# Patient Record
Sex: Male | Born: 1975 | Race: White | Hispanic: No | Marital: Married | Smoking: Current every day smoker
Health system: Southern US, Community
[De-identification: ages and names within clinical notes are randomized; demographics above are authoritative.]

## PROBLEM LIST (undated history)

## (undated) DIAGNOSIS — K449 Diaphragmatic hernia without obstruction or gangrene: Secondary | ICD-10-CM

## (undated) DIAGNOSIS — K219 Gastro-esophageal reflux disease without esophagitis: Secondary | ICD-10-CM

## (undated) HISTORY — DX: Gastro-esophageal reflux disease without esophagitis: K21.9

---

## 2010-01-23 ENCOUNTER — Ambulatory Visit
Admission: RE | Admit: 2010-01-23 | Discharge: 2010-01-23 | Payer: Self-pay | Source: Home / Self Care | Attending: Gastroenterology | Admitting: Gastroenterology

## 2010-01-23 ENCOUNTER — Encounter: Payer: Self-pay | Admitting: Internal Medicine

## 2010-01-23 DIAGNOSIS — R1011 Right upper quadrant pain: Secondary | ICD-10-CM | POA: Insufficient documentation

## 2010-01-23 DIAGNOSIS — K625 Hemorrhage of anus and rectum: Secondary | ICD-10-CM | POA: Insufficient documentation

## 2010-02-08 NOTE — Assessment & Plan Note (Signed)
Summary: Randy Schroeder MOVEMENTS/SS   Visit Type:  Initial Visit Primary Care Provider:  Sherryll Burger  CC:  vomiting and irregular bowel movements.  History of Present Illness: Randy Schroeder is a pleasant 35 year old Caucasian male who is self-referred. He states he had an acute onset of RUQ pain, +nausea and was admitted by Dr Sherryll Burger to [facility] in May 2011 for several days. Reportedly had ultrasound completed, no endoscopy. We do not have these reports currently. He also has a remote hx of endoscopy when he was 35 years old which he reports demonstrated a hiatal hernia. He was doing well from his admission in May 2011 until a few weeks ago when the RUQ pain occurred again, doubling him over, associated with nausea as well. He does have a hx of GERD, but he does not currently take any medications. He takes Aleve if abdominal pain occurs, but this is rare. Denies excedrin, goodys, bc. No dysphagia or odynophagia. Has taken Nexium remotely in past, which seemed to work well for intermittent reflux.  He also reports blood in stool intermittently every 2-3 months X 1 year, usually early in the morning. He has a BM every few days that is soft, without straining. He denies feeling constipated or bloated. He has no FH of colon cancer. He has no loss of appetite or wt loss.   Current Medications (verified): 1)  [place] Vitamins .... Two Tablets Daily  Allergies (verified): No Known Drug Allergies  Past History:  Past Medical History: GERD  Past Surgical History: None  Family History: Mother:living, healthy Father:living, obese, HTN Siblings: 2 sisters non-contributory Grandfather: liver, +ETOH No FH of Colon Cancer:  Social History: Occupation: Health and safety inspector Married X 4 years 2 children boy:9, girl:4 Patient currently smokes. 1/2ppd, 10 years Alcohol Use - no Smoking Status:  current  Review of Systems General:  Denies fever, chills, and anorexia. Eyes:  Denies  blurring, irritation, and discharge. ENT:  Denies sore throat, hoarseness, and difficulty swallowing. CV:  Denies chest pains and syncope. Resp:  Denies dyspnea at rest and wheezing. GI:  Complains of abdominal pain and bloody BM's; denies difficulty swallowing, pain on swallowing, nausea, diarrhea, constipation, black BMs, and fecal incontinence. GU:  Denies urinary burning and urinary hesitancy. MS:  Denies joint pain / LOM, joint swelling, and joint stiffness. Derm:  Denies rash, itching, and dry skin. Neuro:  Denies weakness and syncope. Psych:  Denies depression and anxiety. Endo:  Denies cold intolerance and heat intolerance. Heme:  Denies bruising and bleeding.  Vital Signs:  Patient profile:   35 year old male Height:      71 inches Weight:      197 pounds BMI:     27.58 Temp:     98.8 degrees F oral Pulse rate:   64 / minute BP sitting:   120 / 84  (left arm) Cuff size:   regular  Vitals Entered By: Cloria Spring LPN (January 23, 2010 10:50 AM)  Physical Exam  General:  Well developed, well nourished, no acute distress. Head:  Normocephalic and atraumatic. Eyes:  sclera without icterus, conjuctiva clear Mouth:  No deformity or lesions, dentition normal. Lungs:  Clear throughout to auscultation. Heart:  Regular rate and rhythm; no murmurs, rubs,  or bruits. Abdomen:  +BS, soft, non-tender, non-distended. NO rebound or guarding, No HSM. no hernias noted. Msk:  Symmetrical with no gross deformities. Normal posture. Pulses:  Normal pulses noted. Neurologic:  Alert and  oriented x4;  grossly  normal neurologically. Skin:  Intact without significant lesions or rashes. Psych:  Alert and cooperative. Normal mood and affect.  Impression & Recommendations:  Problem # 1:  ABDOMINAL PAIN-RUQ (ICD-48.2)  35 year old Caucasian male with intermittent but rare episodes of RUQ pain, actually admitted to [facility] for several days by Dr. Sherryll Burger in May 2011, no reports available  currently. Pain associated with nausea. Does have hx of reflux, but is not currently taking medication. Last episode of RUQ pain a few weeks ago, causing him to double over, again with nausea associated. He denies dysphagia, odynophagia. Rare Advil use, only with onset of pain. +smoker. ?biliary component vs PUD. No records currently, and these have been requested.   Retrieve records from [facility] Nexium 40 mg by mouth daily, rx sent to pharmacy of choice, as this has worked for him in the past Possible EGD along with TCS in future if RUQ persists and after reviewing records, labs.   Orders: Consultation Level III ([address])  Problem # 2:  RECTAL BLEEDING (ICD-569.3)  Intermittent rectal bleeding X 1 year, every 2-3 months. No abdominal pain associated, denies constipation, no straining. BM every few days. No loss of appetite or weight loss, no family hx of colon cancer. Most likely r/t benign anorectal source, but occult malignancy can not be excluded. Will schedule for colonoscopy.    TCS with RMR in near future: the risks, benefits, alternatives have been discussed in detail with the pt; verbal consent obtained.   Orders: Consultation Level III (60454) Prescriptions: NEXIUM 40 MG CPDR (ESOMEPRAZOLE MAGNESIUM) take 1 by mouth 30 minutes before breakfast daily  #31 x 5   Entered and Authorized by:   Gerrit Halls NP   Signed by:   Gerrit Halls NP on 01/23/2010   Method used:   Faxed to ...       [facility] (retail)       [address]       [place], [place]  [address]       Ph: [phone]       Fax: [phone]   RxID:   [id number]

## 2010-02-08 NOTE — Letter (Addendum)
Summary: TCS POSS EGD ORDER  TCS POSS EGD ORDER   Imported By: Ave Filter 01/23/2010 11:52:43  _____________________________________________________________________  External Attachment:    Type:   Image     Comment:   External Document  Appended Document: TCS POSS EGD ORDER Patient called and canceled procedure due to insurance not paying for all of the procedure and he wants to cancel for now

## 2010-02-13 ENCOUNTER — Encounter: Payer: Self-pay | Admitting: Internal Medicine

## 2012-04-08 ENCOUNTER — Encounter (HOSPITAL_COMMUNITY): Payer: Self-pay | Admitting: *Deleted

## 2012-04-08 ENCOUNTER — Emergency Department (HOSPITAL_COMMUNITY): Payer: BC Managed Care – PPO

## 2012-04-08 ENCOUNTER — Emergency Department (HOSPITAL_COMMUNITY)
Admission: EM | Admit: 2012-04-08 | Discharge: 2012-04-08 | Disposition: A | Discharge status: Home / Self Care | Payer: BC Managed Care – PPO | Attending: Emergency Medicine | Admitting: Emergency Medicine

## 2012-04-08 DIAGNOSIS — R0602 Shortness of breath: Secondary | ICD-10-CM | POA: Insufficient documentation

## 2012-04-08 DIAGNOSIS — T754XXA Electrocution, initial encounter: Secondary | ICD-10-CM | POA: Insufficient documentation

## 2012-04-08 DIAGNOSIS — Y9389 Activity, other specified: Secondary | ICD-10-CM | POA: Insufficient documentation

## 2012-04-08 DIAGNOSIS — F17200 Nicotine dependence, unspecified, uncomplicated: Secondary | ICD-10-CM | POA: Insufficient documentation

## 2012-04-08 DIAGNOSIS — Z23 Encounter for immunization: Secondary | ICD-10-CM | POA: Insufficient documentation

## 2012-04-08 DIAGNOSIS — Z8719 Personal history of other diseases of the digestive system: Secondary | ICD-10-CM | POA: Insufficient documentation

## 2012-04-08 DIAGNOSIS — W868XXA Exposure to other electric current, initial encounter: Secondary | ICD-10-CM | POA: Insufficient documentation

## 2012-04-08 DIAGNOSIS — Y92009 Unspecified place in unspecified non-institutional (private) residence as the place of occurrence of the external cause: Secondary | ICD-10-CM | POA: Insufficient documentation

## 2012-04-08 HISTORY — DX: Diaphragmatic hernia without obstruction or gangrene: K44.9

## 2012-04-08 LAB — URINALYSIS, ROUTINE W REFLEX MICROSCOPIC
Glucose, UA: NEGATIVE mg/dL
Leukocytes, UA: NEGATIVE
Urobilinogen, UA: 0.2 mg/dL (ref 0.0–1.0)

## 2012-04-08 LAB — URINE MICROSCOPIC-ADD ON

## 2012-04-08 LAB — CBC WITH DIFFERENTIAL/PLATELET
Basophils Relative: 0 % (ref 0–1)
Eosinophils Absolute: 0 10*3/uL (ref 0.0–0.7)
Eosinophils Relative: 0 % (ref 0–5)
Lymphs Abs: 3 10*3/uL (ref 0.7–4.0)
MCH: 27.9 pg (ref 26.0–34.0)
MCHC: 32.8 g/dL (ref 30.0–36.0)
MCV: 85.1 fL (ref 78.0–100.0)
Platelets: 292 10*3/uL (ref 150–400)
RBC: 5.51 MIL/uL (ref 4.22–5.81)
RDW: 14.7 % (ref 11.5–15.5)

## 2012-04-08 LAB — TROPONIN I: Troponin I: <0.3 ng/mL (ref <0.30)

## 2012-04-08 LAB — COMPREHENSIVE METABOLIC PANEL
ALT: 16 U/L (ref 0–53)
Albumin: 4.9 g/dL (ref 3.5–5.2)
Calcium: 10 mg/dL (ref 8.4–10.5)
GFR calc Af Amer: >90 mL/min (ref >90)
Glucose, Bld: 74 mg/dL (ref 70–99)
Sodium: 141 mEq/L (ref 135–145)
Total Protein: 8.4 g/dL — ABNORMAL HIGH (ref 6.0–8.3)

## 2012-04-08 MED ORDER — TETANUS-DIPHTH-ACELL PERTUSSIS 5-2.5-18.5 LF-MCG/0.5 IM SUSP
0.5000 mL | Freq: Once | INTRAMUSCULAR | Status: AC
  Administered 2012-04-08: 0.5 mL via INTRAMUSCULAR
  Filled 2012-04-08: qty 0.5

## 2012-04-08 NOTE — ED Provider Notes (Signed)
History  This chart was scribed for Glynn Octave, MD by Shari Heritage, ED Scribe. The patient was seen in room APAH5/APAH5. Patient's care was started at 1553.   CSN: [id number]  Arrival date & time 04/08/12  1544   First MD Initiated Contact with Patient 04/08/12 1553      Chief Complaint  Patient presents with  . Electric Shock    The history is provided by the patient. No language interpreter was used.    HPI Comments: Randy Schroeder is a 37 y.o. male who presents to the Emergency Department complaining of electric shock from a capacitor on a house hold air conditioning unit immediately prior to arrival. He states that shock came from a 460 V capacitor. He says that he was trying to put a coil on a unit when he was electrocuted. His left forearm came in contact with the metal prongs of the capacitor during the shock. He states that he saw whiite flashes during the shock and developed palpitations which are now resolved. He says that immediately after the shock he was mildly short of breath, but that is improved as well. At this time, patient is close to baseline status. He denies chest pain, headaches, or any pain. He has a hiatal hernia, but has no other pertinent past medical history.    Past Medical History  Diagnosis Date  . Hiatal hernia     Reported by patient 04/08/2012    History reviewed. No pertinent family history.  History  Substance Use Topics  . Smoking status: Current Every Day Smoker  . Smokeless tobacco: Not on file  . Alcohol Use: No      Review of Systems A complete 10 system review of systems was obtained and all systems are negative except as noted in the HPI and PMH.    Allergies  Bee venom  Home Medications   Current Outpatient Rx  Name  Route  Sig  Dispense  Refill  . Multiple Vitamin (MULTIVITAMIN WITH MINERALS) TABS   Oral   Take 1 tablet by mouth every morning.           Triage Vitals: BP 129/84  Pulse 92  Temp(Src) 98.1 F (36.7  C) (Oral)  Resp 18  Ht 5\' 10"  (1.778 m)  Wt 185 lb (83.915 kg)  BMI 26.54 kg/m2  SpO2 98%  Physical Exam  Constitutional: He is oriented to person, place, and time. He appears well-developed and well-nourished.  HENT:  Head: Normocephalic and atraumatic.  Eyes: Conjunctivae and EOM are normal. Pupils are equal, round, and reactive to light.  Neck: Normal range of motion. Neck supple.  Cardiovascular: Normal rate, regular rhythm and normal heart sounds.   No murmur heard. Pulmonary/Chest: Effort normal and breath sounds normal. No accessory muscle usage. No apnea. No respiratory distress. He has no decreased breath sounds. He has no wheezes. He has no rhonchi. He has no rales.  Abdominal: Soft. He exhibits no distension.  Musculoskeletal: Normal range of motion.  Parallel linear abrasion to right dorsal forearm. +2 radial pulse. Cardinal hand movements intact. No scorch marks to palms or soles.  Neurological: He is alert and oriented to person, place, and time.  Skin: Skin is warm and dry.  Psychiatric: He has a normal mood and affect. His behavior is normal.    ED Course  Procedures (including critical care time) DIAGNOSTIC STUDIES: Oxygen Saturation is 98% on room air, normal by my interpretation.    COORDINATION OF CARE: 4:06 PM- Patient  informed of current plan for treatment and evaluation and agrees with plan at this time.   5:24 PM- Chest x-ray is unremarkable. Upon recheck, patient is tolerant of fluids and feels fine. Am awaiting lab results to check kidney and heart function before discharge.  Labs Reviewed  COMPREHENSIVE METABOLIC PANEL - Abnormal; Notable for the following:    Total Protein 8.4 (*)    All other components within normal limits  CBC WITH DIFFERENTIAL  TROPONIN I  CK  URINALYSIS, ROUTINE W REFLEX MICROSCOPIC     Dg Chest 2 View  04/08/2012  *RADIOLOGY REPORT*  Clinical Data: Electrical shock.  CHEST - 2 VIEW  Comparison: 04/18/2009.  Findings: The  cardiac silhouette, mediastinal and hilar contours are within normal limits and stable. The lungs are clear.  No pleural effusions.  The bony thorax is intact.  IMPRESSION: Normal chest x-ray.   Original Report Authenticated By: Rudie Meyer, M.D.      1. Electrical shock of hand, initial encounter       MDM  Presents 3 hours after an electrical shock from an air conditioning unit that was 460 V. Denies chest pain, nausea, vomiting, palpitations. Uncertain if he lost consciousness but says he saw some white flashes. Feels back to baseline now. Abrasion on the dorsal forearm from metal prongs.  Update tetanus. Chest x-ray negative. Labs unremarkable with normal creatinine and CK   patient tolerating by mouth in ambulatory in the ED. No arrhythmias noted on monitor.    Date: 04/08/2012  Rate: 99  Rhythm: normal sinus rhythm  QRS Axis: normal  Intervals: normal  ST/T Wave abnormalities: normal  Conduction Disutrbances:none  Narrative Interpretation:   Old EKG Reviewed: none available     I personally performed the services described in this documentation, which was scribed in my presence. The recorded information has been reviewed and is accurate.       Glynn Octave, MD 04/08/12 1736

## 2012-04-08 NOTE — ED Notes (Signed)
eletrocuted by an air condition unit, 460 volt (starting compacity), states he does not feel right, denies cp but felt like heart racing earlier, saw white flashes and pt unknown of LOC, has wound to right forearm

## 2012-04-08 NOTE — ED Notes (Signed)
Patient ambulated around nurses station without difficulty and with a steady gait.  Patient drinking sprite without difficulty. No needs voiced at this time.

## 2013-10-10 ENCOUNTER — Inpatient Hospital Stay (HOSPITAL_COMMUNITY)
Admission: EM | Admit: 2013-10-10 | Discharge: 2013-10-12 | DRG: 395 | Disposition: A | Discharge status: Home / Self Care | Payer: 59 | Attending: General Surgery | Admitting: General Surgery

## 2013-10-10 ENCOUNTER — Encounter (HOSPITAL_COMMUNITY): Payer: Self-pay | Admitting: Emergency Medicine

## 2013-10-10 ENCOUNTER — Emergency Department (HOSPITAL_COMMUNITY): Payer: 59

## 2013-10-10 DIAGNOSIS — R6510 Systemic inflammatory response syndrome (SIRS) of non-infectious origin without acute organ dysfunction: Secondary | ICD-10-CM

## 2013-10-10 DIAGNOSIS — Z23 Encounter for immunization: Secondary | ICD-10-CM

## 2013-10-10 DIAGNOSIS — K6289 Other specified diseases of anus and rectum: Secondary | ICD-10-CM | POA: Diagnosis not present

## 2013-10-10 DIAGNOSIS — K611 Rectal abscess: Secondary | ICD-10-CM | POA: Diagnosis not present

## 2013-10-10 DIAGNOSIS — F17200 Nicotine dependence, unspecified, uncomplicated: Secondary | ICD-10-CM | POA: Diagnosis present

## 2013-10-10 DIAGNOSIS — K219 Gastro-esophageal reflux disease without esophagitis: Secondary | ICD-10-CM | POA: Diagnosis present

## 2013-10-10 LAB — COMPREHENSIVE METABOLIC PANEL
ALK PHOS: 82 U/L (ref 39–117)
ALT: 31 U/L (ref 0–53)
ANION GAP: 17 — AB (ref 5–15)
AST: 16 U/L (ref 0–37)
Albumin: 3.9 g/dL (ref 3.5–5.2)
BILIRUBIN TOTAL: 1 mg/dL (ref 0.3–1.2)
BUN: 10 mg/dL (ref 6–23)
CO2: 18 mEq/L — ABNORMAL LOW (ref 19–32)
Calcium: 9.4 mg/dL (ref 8.4–10.5)
Chloride: 97 mEq/L (ref 96–112)
Creatinine, Ser: 0.81 mg/dL (ref 0.50–1.35)
GLUCOSE: 105 mg/dL — AB (ref 70–99)
POTASSIUM: 3.7 meq/L (ref 3.7–5.3)
Sodium: 132 mEq/L — ABNORMAL LOW (ref 137–147)
TOTAL PROTEIN: 8.1 g/dL (ref 6.0–8.3)

## 2013-10-10 LAB — SURGICAL PCR SCREEN
MRSA, PCR: NEGATIVE
Staphylococcus aureus: NEGATIVE

## 2013-10-10 LAB — CBC WITH DIFFERENTIAL/PLATELET
BASOS PCT: 0 % (ref 0–1)
Basophils Absolute: 0 10*3/uL (ref 0.0–0.1)
EOS PCT: 0 % (ref 0–5)
Eosinophils Absolute: 0 10*3/uL (ref 0.0–0.7)
HCT: 42.8 % (ref 39.0–52.0)
Hemoglobin: 15 g/dL (ref 13.0–17.0)
LYMPHS ABS: 1.5 10*3/uL (ref 0.7–4.0)
Lymphocytes Relative: 7 % — ABNORMAL LOW (ref 12–46)
MCH: 29.2 pg (ref 26.0–34.0)
MCHC: 35 g/dL (ref 30.0–36.0)
MCV: 83.3 fL (ref 78.0–100.0)
MONO ABS: 1.5 10*3/uL — AB (ref 0.1–1.0)
Monocytes Relative: 7 % (ref 3–12)
NEUTROS PCT: 86 % — AB (ref 43–77)
Neutro Abs: 18.6 10*3/uL — ABNORMAL HIGH (ref 1.7–7.7)
Platelets: 311 10*3/uL (ref 150–400)
RBC: 5.14 MIL/uL (ref 4.22–5.81)
RDW: 14 % (ref 11.5–15.5)
WBC: 21.6 10*3/uL — AB (ref 4.0–10.5)

## 2013-10-10 MED ORDER — OXYCODONE HCL 5 MG PO TABS
5.0000 mg | ORAL_TABLET | ORAL | Status: DC | PRN
  Administered 2013-10-11 – 2013-10-12 (×5): 5 mg via ORAL
  Filled 2013-10-10 (×5): qty 1

## 2013-10-10 MED ORDER — METRONIDAZOLE IN NACL 5-0.79 MG/ML-% IV SOLN
500.0000 mg | Freq: Three times a day (TID) | INTRAVENOUS | Status: DC
  Administered 2013-10-10 – 2013-10-11 (×5): 500 mg via INTRAVENOUS
  Filled 2013-10-10 (×6): qty 100

## 2013-10-10 MED ORDER — INFLUENZA VAC SPLIT QUAD 0.5 ML IM SUSY
0.5000 mL | PREFILLED_SYRINGE | INTRAMUSCULAR | Status: DC
  Filled 2013-10-10: qty 0.5

## 2013-10-10 MED ORDER — LACTATED RINGERS IV SOLN
INTRAVENOUS | Status: DC
  Administered 2013-10-10 – 2013-10-11 (×2): via INTRAVENOUS

## 2013-10-10 MED ORDER — LORAZEPAM 2 MG/ML IJ SOLN: 1.0000 mg | INTRAMUSCULAR | Status: DC | PRN

## 2013-10-10 MED ORDER — HYDROMORPHONE HCL 1 MG/ML IJ SOLN
1.0000 mg | Freq: Once | INTRAMUSCULAR | Status: AC
  Administered 2013-10-10: 1 mg via INTRAVENOUS
  Filled 2013-10-10: qty 1

## 2013-10-10 MED ORDER — ONDANSETRON HCL 4 MG/2ML IJ SOLN: 4.0000 mg | Freq: Four times a day (QID) | INTRAMUSCULAR | Status: DC | PRN

## 2013-10-10 MED ORDER — PANTOPRAZOLE SODIUM 40 MG PO TBEC
40.0000 mg | DELAYED_RELEASE_TABLET | Freq: Every day | ORAL | Status: DC
  Administered 2013-10-11: 40 mg via ORAL
  Filled 2013-10-10: qty 1

## 2013-10-10 MED ORDER — ENOXAPARIN SODIUM 40 MG/0.4ML ~~LOC~~ SOLN
40.0000 mg | SUBCUTANEOUS | Status: DC
  Administered 2013-10-10: 40 mg via SUBCUTANEOUS
  Filled 2013-10-10: qty 0.4

## 2013-10-10 MED ORDER — HYDROMORPHONE HCL 1 MG/ML IJ SOLN
1.0000 mg | INTRAMUSCULAR | Status: DC | PRN
  Administered 2013-10-10 – 2013-10-11 (×6): 1 mg via INTRAVENOUS
  Filled 2013-10-10 (×7): qty 1

## 2013-10-10 MED ORDER — SODIUM CHLORIDE 0.9 % IV BOLUS (SEPSIS)
1000.0000 mL | Freq: Once | INTRAVENOUS | Status: AC
  Administered 2013-10-10: 1000 mL via INTRAVENOUS

## 2013-10-10 MED ORDER — ACETAMINOPHEN 650 MG RE SUPP: 650.0000 mg | Freq: Four times a day (QID) | RECTAL | Status: DC | PRN

## 2013-10-10 MED ORDER — IOHEXOL 300 MG/ML  SOLN
50.0000 mL | Freq: Once | INTRAMUSCULAR | Status: AC | PRN
  Administered 2013-10-10: 50 mL via ORAL

## 2013-10-10 MED ORDER — DIPHENHYDRAMINE HCL 12.5 MG/5ML PO ELIX: 12.5000 mg | ORAL_SOLUTION | Freq: Four times a day (QID) | ORAL | Status: DC | PRN

## 2013-10-10 MED ORDER — METOCLOPRAMIDE HCL 5 MG/ML IJ SOLN
10.0000 mg | Freq: Once | INTRAMUSCULAR | Status: AC
  Administered 2013-10-10: 10 mg via INTRAVENOUS
  Filled 2013-10-10: qty 2

## 2013-10-10 MED ORDER — DIPHENHYDRAMINE HCL 50 MG/ML IJ SOLN: 12.5000 mg | Freq: Four times a day (QID) | INTRAMUSCULAR | Status: DC | PRN

## 2013-10-10 MED ORDER — CLINDAMYCIN PHOSPHATE 900 MG/50ML IV SOLN
900.0000 mg | Freq: Three times a day (TID) | INTRAVENOUS | Status: DC
  Administered 2013-10-11 – 2013-10-12 (×2): 900 mg via INTRAVENOUS
  Filled 2013-10-10 (×3): qty 50

## 2013-10-10 MED ORDER — CLINDAMYCIN PHOSPHATE 600 MG/50ML IV SOLN
600.0000 mg | Freq: Once | INTRAVENOUS | Status: AC
  Administered 2013-10-10: 600 mg via INTRAVENOUS
  Filled 2013-10-10: qty 50

## 2013-10-10 MED ORDER — CHLORHEXIDINE GLUCONATE 4 % EX LIQD
1.0000 "application " | Freq: Once | CUTANEOUS | Status: AC
  Administered 2013-10-11: 1 via TOPICAL
  Filled 2013-10-10: qty 15

## 2013-10-10 MED ORDER — NICOTINE 21 MG/24HR TD PT24
21.0000 mg | MEDICATED_PATCH | Freq: Every day | TRANSDERMAL | Status: DC
  Filled 2013-10-10 (×3): qty 1

## 2013-10-10 MED ORDER — ACETAMINOPHEN 325 MG PO TABS
650.0000 mg | ORAL_TABLET | Freq: Four times a day (QID) | ORAL | Status: DC | PRN
  Administered 2013-10-10 – 2013-10-11 (×2): 650 mg via ORAL
  Filled 2013-10-10 (×2): qty 2

## 2013-10-10 MED ORDER — IOHEXOL 300 MG/ML  SOLN
100.0000 mL | Freq: Once | INTRAMUSCULAR | Status: AC | PRN
  Administered 2013-10-10: 100 mL via INTRAVENOUS

## 2013-10-10 NOTE — ED Notes (Signed)
Pt reports face tingling, esp around mouth, also reports hands tingling. When BP cuff inflated on left arm, left hand contracts

## 2013-10-10 NOTE — ED Provider Notes (Signed)
CSN: [id number]     Arrival date & time 10/10/13  1012 History   This chart was scribed for Enid SkeensJoshua M Abrar Koone, MD, by Yevette EdwardsAngela Bracken, ED Scribe. This patient was seen in room APA03/APA03 and the patient's care was started at 11:07 AM.  First MD Initiated Contact with Patient 10/10/13 1056     Chief Complaint  Patient presents with  . Rectal Pain    Patient is a 38 y.o. male presenting with abscess. The history is provided by the patient and the spouse. No language interpreter was used.  Abscess Location:  Ano-genital Ano-genital abscess location:  Anus Abscess quality: painful and redness   Duration:  5 days Progression:  Worsening Pain details:    Severity:  Severe (6/10)   Progression:  Worsening Chronicity:  New Relieved by:  Nothing Associated symptoms: fever, nausea and vomiting   Risk factors: no hx of MRSA    HPI Comments: Randy BreedingRichard Schroeder is a 38 y.o. male who presents to the Emergency Department complaining of severe rectal pain associated with a suspected abscess which was first observed five days ago and which has worsened in the past three days. He endorses fever, chills, nausea, and emesis; in the ED the pt's temperature is 100.4 F. He denies a h/o similar symptoms. He also denies a cough, headache, or abdominal pain. He endorses a h/o hiatal hernia. The pt denies a h/o MRSA.    Past Medical History  Diagnosis Date  . GERD (gastroesophageal reflux disease)   . Hiatal hernia     Reported by patient 04/08/2012   History reviewed. No pertinent past surgical history. No family history on file. History  Substance Use Topics  . Smoking status: Current Every Day Smoker  . Smokeless tobacco: Not on file  . Alcohol Use: No    Review of Systems  Constitutional: Positive for fever.  Gastrointestinal: Positive for nausea and vomiting.    A complete 10 system review of systems was obtained, and all systems were negative except where indicated in the HPI and PE.     Allergies  Bee venom  Home Medications   Prior to Admission medications   Medication Sig Start Date End Date Taking? Authorizing Provider  acetaminophen (TYLENOL) 500 MG tablet Take 1,000 mg by mouth every 6 (six) hours as needed for moderate pain.   Yes Historical Provider, MD  diazepam (VALIUM) 2 MG tablet Take 1 tablet by mouth at bedtime.  09/14/13  Yes Historical Provider, MD  Multiple Vitamin (MULTIVITAMIN WITH MINERALS) TABS Take 1 tablet by mouth every morning.   Yes Historical Provider, MD  Pediatric Multivit-Minerals-C (FLINTSTONES COMPLETE PO) Take by mouth.     Yes Historical Provider, MD   Triage Vitals: BP 135/74  Pulse 115  Temp(Src) 100.4 F (38 C) (Oral)  Resp 35  Ht 5\' 10"  (1.778 m)  Wt 200 lb (90.719 kg)  BMI 28.70 kg/m2  SpO2 100%  Physical Exam  Nursing note and vitals reviewed. Constitutional: He appears well-developed and well-nourished. No distress.  HENT:  Head: Normocephalic and atraumatic.  Neck: Normal range of motion.  Cardiovascular: Regular rhythm.   Fast rate; tachycardia.   Pulmonary/Chest: Effort normal and breath sounds normal. No respiratory distress. He has no wheezes.  Abdominal: Soft. There is no tenderness.  Genitourinary:  Erythema, induration and tenderness perirectal, no drainage, tender on rectal exam, mild fluctuance  Neurological: He is alert.  Skin: Skin is warm and dry.  Induration and tenderness on the left inner aspect  of the buttock fold. Very tender to palpation. Warm to palpation. Tenderness along inner aspect.   Psychiatric: He has a normal mood and affect. His behavior is normal.    ED Course  Procedures (including critical care time)  DIAGNOSTIC STUDIES: Oxygen Saturation is 100% on room air, normal by my interpretation.    COORDINATION OF CARE:  11:13 AM- Discussed treatment plan with patient, and the patient agreed to the plan. The plan includes pain medication, IV fluids, and a CT scan. Advised pt that he  may require surgery depending upon the severity of the abscess.   Labs Review Labs Reviewed  CBC WITH DIFFERENTIAL - Abnormal; Notable for the following:    WBC 21.6 (*)    Neutrophils Relative % 86 (*)    Lymphocytes Relative 7 (*)    Neutro Abs 18.6 (*)    Monocytes Absolute 1.5 (*)    All other components within normal limits  COMPREHENSIVE METABOLIC PANEL - Abnormal; Notable for the following:    Sodium 132 (*)    CO2 18 (*)    Glucose, Bld 105 (*)    Anion gap 17 (*)    All other components within normal limits  CULTURE, BLOOD (SINGLE)    Imaging Review Ct Abdomen Pelvis W Contrast  10/10/2013   CLINICAL DATA:  Severe rectal pain with suspected abscess. First observed approximately 5 days ago though has worsened. Fever, chills, nausea and vomiting. Initial encounter.  EXAM: CT ABDOMEN AND PELVIS WITH CONTRAST  TECHNIQUE: Multidetector CT imaging of the abdomen and pelvis was performed using the standard protocol following bolus administration of intravenous contrast.  CONTRAST:  50mL OMNIPAQUE IOHEXOL 300 MG/ML SOLN, OMNIPAQUE IOHEXOL 300 MG/ML SOLN  COMPARISON:  None.  FINDINGS: There is an approximately 3.1 x 2.0 x 3.5 cm peripherally enhancing abscess about the left side of the anus (as measured in greatest oblique axial - image 101, series 2 and coronal- image 69, series 5) dimensions respectively. There is a tiny focus of subcutaneous emphysema within the nondependent portion of this fluid collection (representative axial images 99, series 2 and coronal image 69, series 6). This finding is associated with a minimal amount of adjacent subcutaneous stranding.  The bowel is otherwise normal in course and caliber without wall thickening. Enteric contrast extends to the level of distal small bowel. No evidence of enteric obstruction. Normal appearance of the appendix. No pneumoperitoneum, pneumatosis or portal venous gas.  Normal hepatic contour. There is a minimal amount of focal  fatty infiltration adjacent to the fissure for the ligamentum teres. Normal appearance of the gallbladder. No radiopaque gallstones. No intra or extrahepatic biliary duct dilatation. No ascites.  There is symmetric enhancement and excretion of the bilateral kidneys. No definite renal stones on this postcontrast examination. Note is made of a subcentimeter hypoattenuating lesion within the superior pole the right kidney (coronal image 77, series 3) which is too small to adequately characterize of favored to represent a renal cyst. No urinary obstruction or perinephric stranding. Normal appearance of the bilateral adrenal glands, pancreas and spleen. Incidental note is made of a small splenule.  Normal caliber the abdominal aorta. The major branch vessels of the abdominal aorta appear widely patent on this non CTA examination. 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  No retroperitoneal, mesenteric, pelvic or inguinal lymphadenopathy.  Normal appearance of the prostate. Normal appearance of  the urinary bladder given degree of distention. No free fluid in the pelvic cul-de-sac.  Limited visualization of the lower thorax demonstrates minimal grossly symmetric ground-glass atelectasis. No discrete focal airspace opacities.  Normal heart size.  No pericardial effusion.  No acute or aggressive osseous abnormalities.  Regional soft tissues appear normal.  IMPRESSION: Small (approximately 3.5 cm) perirectal abscess about the left side of the anus with associated tiny focus of subcutaneous emphysema. Correlation with direct visualization is recommended.   Electronically Signed   By: Simonne Come M.D.   On: 10/10/2013 12:29     EKG Interpretation None      MDM   Final diagnoses:  Perirectal abscess  Rectal or anal pain  SIRS (systemic inflammatory response syndrome)   I personally performed the  services described in this documentation, which was scribed in my presence. The recorded information has been reviewed and is accurate.  Patient with clinically perirectal/perianal abscess. Significant pain in ER with Sirs criteria. Patient's septic from abscess. IV abx given. CT scan results reviewed showing 3.5 x 3 cm perirectal abscess. With location, significant pain and sepsis criteria consult to Gen. surgery for further treatment and evaluation. General surgeon will evaluate ER and likely plan for admission.  The patients results and plan were reviewed and discussed.   Any x-rays performed were personally reviewed by myself.   Differential diagnosis were considered with the presenting HPI.  Medications  HYDROmorphone (DILAUDID) injection 1 mg (1 mg Intravenous Given 10/10/13 1133)  sodium chloride 0.9 % bolus 1,000 mL (0 mLs Intravenous Stopped 10/10/13 1347)  clindamycin (CLEOCIN) IVPB 600 mg (0 mg Intravenous Stopped 10/10/13 1238)  metoCLOPramide (REGLAN) injection 10 mg (10 mg Intravenous Given 10/10/13 1133)  iohexol (OMNIPAQUE) 300 MG/ML solution 50 mL (50 mLs Oral Contrast Given 10/10/13 1128)  iohexol (OMNIPAQUE) 300 MG/ML solution 100 mL (100 mLs Intravenous Contrast Given 10/10/13 1202)  HYDROmorphone (DILAUDID) injection 1 mg (1 mg Intravenous Given 10/10/13 1328)    Filed Vitals:   10/10/13 1025 10/10/13 1100 10/10/13 1150 10/10/13 1351  BP: 135/74 118/64 128/86 131/68  Pulse: 115 98  94  Temp: 100.4 F (38 C)   100.7 F (38.2 C)  TempSrc: Oral   Oral  Resp: 35 17 22 13   Height: 5\' 10"  (1.778 m)     Weight: 200 lb (90.719 kg)     SpO2: 100% 100%  96%    Final diagnoses:  None    Admission/ observation were discussed with the admitting physician, patient and/or family and they are comfortable with the plan.    Enid Skeens, MD 10/10/13 [phone]

## 2013-10-10 NOTE — ED Notes (Signed)
Dr. Lovell SheehanJenkins contacted per MD.

## 2013-10-10 NOTE — H&P (Signed)
Randy Schroeder is an 38 y.o. male.   Chief Complaint: Perirectal abscess HPI: Patient is a 38 year old white male who presents with a four-day history of worsening pain in the perirectal region. He denies any drainage. 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 He stopped this one and a half weeks ago.  Past Medical History  Diagnosis Date  . GERD (gastroesophageal reflux disease)   . Hiatal hernia     Reported by patient 04/08/2012    History reviewed. No pertinent past surgical history.  No family history on file. Social History:  reports that he has been smoking.  He does not have any smokeless tobacco history on file. He reports that he does not drink alcohol or use illicit drugs.  Allergies:  Allergies  Allergen Reactions  . Bee Venom Anaphylaxis     (Not in a hospital admission)  Results for orders placed during the hospital encounter of 10/10/13 (from the past 48 hour(s))  CBC WITH DIFFERENTIAL     Status: Abnormal   Collection Time    10/10/13 11:16 AM      Result Value Ref Range   WBC 21.6 (*) 4.0 - 10.5 K/uL   RBC 5.14  4.22 - 5.81 MIL/uL   Hemoglobin 15.0  13.0 - 17.0 g/dL   HCT 42.8  39.0 - 52.0 %   MCV 83.3  78.0 - 100.0 fL   MCH 29.2  26.0 - 34.0 pg   MCHC 35.0  30.0 - 36.0 g/dL   RDW 14.0  11.5 - 15.5 %   Platelets 311  150 - 400 K/uL   Neutrophils Relative % 86 (*) 43 - 77 %   Lymphocytes Relative 7 (*) 12 - 46 %   Monocytes Relative 7  3 - 12 %   Eosinophils Relative 0  0 - 5 %   Basophils Relative 0  0 - 1 %   Neutro Abs 18.6 (*) 1.7 - 7.7 K/uL   Lymphs Abs 1.5  0.7 - 4.0 K/uL   Monocytes Absolute 1.5 (*) 0.1 - 1.0 K/uL   Eosinophils Absolute 0.0  0.0 - 0.7 K/uL   Basophils Absolute 0.0  0.0 - 0.1 K/uL  COMPREHENSIVE METABOLIC PANEL     Status: Abnormal   Collection Time    10/10/13 11:16 AM      Result Value Ref Range   Sodium 132 (*) 137 - 147 mEq/L   Potassium 3.7  3.7 - 5.3 mEq/L   Chloride 97  96 - 112 mEq/L   CO2 18 (*) 19 - 32  mEq/L   Glucose, Bld 105 (*) 70 - 99 mg/dL   BUN 10  6 - 23 mg/dL   Creatinine, Ser 0.81  0.50 - 1.35 mg/dL   Calcium 9.4  8.4 - 10.5 mg/dL   Total Protein 8.1  6.0 - 8.3 g/dL   Albumin 3.9  3.5 - 5.2 g/dL   AST 16  0 - 37 U/L   ALT 31  0 - 53 U/L   Alkaline Phosphatase 82  39 - 117 U/L   Total Bilirubin 1.0  0.3 - 1.2 mg/dL   GFR calc non Af Amer >90  >90 mL/min   GFR calc Af Amer >90  >90 mL/min   Comment: (NOTE)     The eGFR has been calculated using the CKD EPI equation.     This calculation has not been validated in all clinical situations.     eGFR's persistently <90 mL/min  signify possible Chronic Kidney     Disease.   Anion gap 17 (*) 5 - 15   Ct Abdomen Pelvis W Contrast  10/10/2013   CLINICAL DATA:  Severe rectal pain with suspected abscess. First observed approximately 5 days ago though has worsened. Fever, chills, nausea and vomiting. Initial encounter.  EXAM: CT ABDOMEN AND PELVIS WITH CONTRAST  TECHNIQUE: Multidetector CT imaging of the abdomen and pelvis was performed using the standard protocol following bolus administration of intravenous contrast.  CONTRAST:  43m OMNIPAQUE IOHEXOL 300 MG/ML SOLN, 1051mOMNIPAQUE IOHEXOL 300 MG/ML SOLN  COMPARISON:  None.  FINDINGS: There is an approximately 3.1 x 2.0 x 3.5 cm peripherally enhancing abscess about the left side of the anus (as measured in greatest oblique axial - image 101, series 2 and coronal- image 69, series 5) dimensions respectively. There is a tiny focus of subcutaneous emphysema within the nondependent portion of this fluid collection (representative axial images 99, series 2 and coronal image 69, series 6). This finding is associated with a minimal amount of adjacent subcutaneous stranding.  The bowel is otherwise normal in course and caliber without wall thickening. Enteric contrast extends to the level of distal small bowel. No evidence of enteric obstruction. Normal appearance of the appendix. No pneumoperitoneum,  pneumatosis or portal venous gas.  Normal hepatic contour. There is a minimal amount of focal fatty infiltration adjacent to the fissure for the ligamentum teres. Normal appearance of the gallbladder. No radiopaque gallstones. No intra or extrahepatic biliary duct dilatation. No ascites.  There is symmetric enhancement and excretion of the bilateral kidneys. No definite renal stones on this postcontrast examination. Note is made of a subcentimeter hypoattenuating lesion within the superior pole the right kidney (coronal image 77, series 3) which is too small to adequately characterize of favored to represent a renal cyst. No urinary obstruction or perinephric stranding. Normal appearance of the bilateral adrenal glands, pancreas and spleen. Incidental note is made of a small splenule.  Normal caliber the abdominal aorta. The major branch vessels of the abdominal aorta appear widely patent on this non CTA examination. 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  No retroperitoneal, mesenteric, pelvic or inguinal lymphadenopathy.  Normal appearance of the prostate. Normal appearance of the urinary bladder given degree of distention. No free fluid in the pelvic cul-de-sac.  Limited visualization of the lower thorax demonstrates minimal grossly symmetric ground-glass atelectasis. No discrete focal airspace opacities.  Normal heart size.  No pericardial effusion.  No acute or aggressive osseous abnormalities.  Regional soft tissues appear normal.  IMPRESSION: Small (approximately 3.5 cm) perirectal abscess about the left side of the anus with associated tiny focus of subcutaneous emphysema. Correlation with direct visualization is recommended.   Electronically Signed   By: JoSandi Mariscal.D.   On: 10/10/2013 12:29    Review of Systems  Constitutional: Positive for malaise/fatigue.  HENT: Negative.   Eyes:  Negative.   Respiratory: Negative.   Cardiovascular: Negative.   Gastrointestinal: Negative.   Genitourinary: Negative.   Musculoskeletal: Negative.   Skin: Negative.     Blood pressure 131/68, pulse 94, temperature 100.7 F (38.2 C), temperature source Oral, resp. rate 13, height _0  (1.778 m), weight 90.719 kg (200 lb), SpO2 96.00%. Physical Exam  Vitals reviewed. Constitutional: He is oriented to person, place, and time. He appears well-developed and well-nourished.  HENT:  Head:  Normocephalic and atraumatic.  Neck: Normal range of motion. Neck supple.  Cardiovascular: Normal rate, regular rhythm and normal heart sounds.   Respiratory: Effort normal and breath sounds normal.  GI: Soft. Bowel sounds are normal. He exhibits no distension. There is no tenderness.  Genitourinary:  Fluctuant tender mass along the left perirectal region. No drainage noted. Rectal examination limited secondary to pain.  Neurological: He is alert and oriented to person, place, and time.  Skin: Skin is warm and dry.     Assessment/Plan Impression: Perirectal abscess Plan: Patient remained in the hospital for IV clindamycin and Flagyl. He subsequently will undergo incision and drainage of the perirectal abscess. The risks and benefits of the procedure were fully explained to the patient, who gave informed consent.  Jariyah Hackley A 10/10/2013, 1:55 PM

## 2013-10-10 NOTE — ED Notes (Signed)
Pt here for rectal pain.  Pt states that he has a "knot" next to his rectum which came up on Wednesday and he thought it was a hemorrhoid which he treated at home without any relief.  Pt states that it was very painful and he began having fevers.  Pt arrives appearing in pain, hyperventilating (with contracted hands-given bag) and diaphoretic.  Pt has been having fever and chills since Friday with vomiting

## 2013-10-11 ENCOUNTER — Encounter (HOSPITAL_COMMUNITY): Payer: Self-pay | Admitting: *Deleted

## 2013-10-11 ENCOUNTER — Observation Stay (HOSPITAL_COMMUNITY): Payer: 59 | Admitting: Anesthesiology

## 2013-10-11 ENCOUNTER — Encounter (HOSPITAL_COMMUNITY)
Admission: EM | Disposition: A | Discharge status: Home / Self Care | Payer: Self-pay | Source: Home / Self Care | Attending: General Surgery

## 2013-10-11 ENCOUNTER — Encounter (HOSPITAL_COMMUNITY): Payer: 59 | Admitting: Anesthesiology

## 2013-10-11 DIAGNOSIS — K611 Rectal abscess: Secondary | ICD-10-CM | POA: Diagnosis present

## 2013-10-11 DIAGNOSIS — K6289 Other specified diseases of anus and rectum: Secondary | ICD-10-CM | POA: Diagnosis present

## 2013-10-11 DIAGNOSIS — F17200 Nicotine dependence, unspecified, uncomplicated: Secondary | ICD-10-CM | POA: Diagnosis present

## 2013-10-11 DIAGNOSIS — K219 Gastro-esophageal reflux disease without esophagitis: Secondary | ICD-10-CM | POA: Diagnosis present

## 2013-10-11 DIAGNOSIS — Z23 Encounter for immunization: Secondary | ICD-10-CM | POA: Diagnosis not present

## 2013-10-11 HISTORY — PX: INCISION AND DRAINAGE ABSCESS: SHX5864

## 2013-10-11 LAB — BASIC METABOLIC PANEL
Anion gap: 14 (ref 5–15)
BUN: 8 mg/dL (ref 6–23)
CO2: 22 meq/L (ref 19–32)
Calcium: 8.7 mg/dL (ref 8.4–10.5)
Chloride: 100 mEq/L (ref 96–112)
Creatinine, Ser: 0.75 mg/dL (ref 0.50–1.35)
GFR calc Af Amer: >90 mL/min (ref >90)
GFR calc non Af Amer: >90 mL/min (ref >90)
GLUCOSE: 89 mg/dL (ref 70–99)
POTASSIUM: 3.6 meq/L — AB (ref 3.7–5.3)
Sodium: 136 mEq/L — ABNORMAL LOW (ref 137–147)

## 2013-10-11 LAB — CBC
HEMATOCRIT: 39.5 % (ref 39.0–52.0)
Hemoglobin: 13.5 g/dL (ref 13.0–17.0)
MCH: 28.7 pg (ref 26.0–34.0)
MCHC: 34.2 g/dL (ref 30.0–36.0)
MCV: 84 fL (ref 78.0–100.0)
PLATELETS: 316 10*3/uL (ref 150–400)
RBC: 4.7 MIL/uL (ref 4.22–5.81)
RDW: 14.2 % (ref 11.5–15.5)
WBC: 20.3 10*3/uL — ABNORMAL HIGH (ref 4.0–10.5)

## 2013-10-11 SURGERY — INCISION AND DRAINAGE, ABSCESS
Anesthesia: General

## 2013-10-11 MED ORDER — BUPIVACAINE HCL (PF) 0.5 % IJ SOLN
INTRAMUSCULAR | Status: DC | PRN
  Administered 2013-10-11: 10 mL

## 2013-10-11 MED ORDER — SODIUM CHLORIDE 0.9 % IR SOLN
Status: DC | PRN
  Administered 2013-10-11: 1000 mL

## 2013-10-11 MED ORDER — LACTATED RINGERS IV SOLN
INTRAVENOUS | Status: DC
  Administered 2013-10-11 – 2013-10-12 (×2): via INTRAVENOUS

## 2013-10-11 MED ORDER — ONDANSETRON HCL 4 MG/2ML IJ SOLN
INTRAMUSCULAR | Status: AC
  Filled 2013-10-11: qty 2

## 2013-10-11 MED ORDER — PROPOFOL 10 MG/ML IV BOLUS
INTRAVENOUS | Status: DC | PRN
  Administered 2013-10-11: 200 mg via INTRAVENOUS

## 2013-10-11 MED ORDER — FENTANYL CITRATE 0.05 MG/ML IJ SOLN
INTRAMUSCULAR | Status: AC
  Filled 2013-10-11: qty 5

## 2013-10-11 MED ORDER — ROCURONIUM BROMIDE 50 MG/5ML IV SOLN
INTRAVENOUS | Status: AC
  Filled 2013-10-11: qty 1

## 2013-10-11 MED ORDER — ONDANSETRON HCL 4 MG/2ML IJ SOLN
4.0000 mg | Freq: Once | INTRAMUSCULAR | Status: AC
  Administered 2013-10-11: 4 mg via INTRAVENOUS

## 2013-10-11 MED ORDER — SUCCINYLCHOLINE CHLORIDE 20 MG/ML IJ SOLN
INTRAMUSCULAR | Status: AC
  Filled 2013-10-11: qty 1

## 2013-10-11 MED ORDER — FENTANYL CITRATE 0.05 MG/ML IJ SOLN
25.0000 ug | INTRAMUSCULAR | Status: AC
  Administered 2013-10-11 (×2): 25 ug via INTRAVENOUS
  Filled 2013-10-11: qty 2

## 2013-10-11 MED ORDER — PROPOFOL 10 MG/ML IV BOLUS
INTRAVENOUS | Status: AC
  Filled 2013-10-11: qty 20

## 2013-10-11 MED ORDER — ONDANSETRON HCL 4 MG/2ML IJ SOLN: 4.0000 mg | Freq: Once | INTRAMUSCULAR | Status: DC | PRN

## 2013-10-11 MED ORDER — ROCURONIUM BROMIDE 100 MG/10ML IV SOLN
INTRAVENOUS | Status: DC | PRN
  Administered 2013-10-11: 5 mg via INTRAVENOUS

## 2013-10-11 MED ORDER — FENTANYL CITRATE 0.05 MG/ML IJ SOLN
50.0000 ug | Freq: Once | INTRAMUSCULAR | Status: AC
  Administered 2013-10-11: 50 ug via INTRAVENOUS

## 2013-10-11 MED ORDER — LIDOCAINE HCL (PF) 1 % IJ SOLN
INTRAMUSCULAR | Status: AC
  Filled 2013-10-11: qty 5

## 2013-10-11 MED ORDER — BUPIVACAINE HCL (PF) 0.5 % IJ SOLN
INTRAMUSCULAR | Status: AC
  Filled 2013-10-11: qty 30

## 2013-10-11 MED ORDER — LIDOCAINE HCL 1 % IJ SOLN
INTRAMUSCULAR | Status: DC | PRN
  Administered 2013-10-11: 30 mg via INTRADERMAL

## 2013-10-11 MED ORDER — MIDAZOLAM HCL 2 MG/2ML IJ SOLN
1.0000 mg | INTRAMUSCULAR | Status: DC | PRN
  Administered 2013-10-11: 2 mg via INTRAVENOUS

## 2013-10-11 MED ORDER — INFLUENZA VAC SPLIT QUAD 0.5 ML IM SUSY
0.5000 mL | PREFILLED_SYRINGE | INTRAMUSCULAR | Status: DC
Start: 2013-10-12 — End: 2013-10-12
  Filled 2013-10-11: qty 0.5

## 2013-10-11 MED ORDER — KETOROLAC TROMETHAMINE 30 MG/ML IJ SOLN
INTRAMUSCULAR | Status: AC
  Filled 2013-10-11: qty 1

## 2013-10-11 MED ORDER — FENTANYL CITRATE 0.05 MG/ML IJ SOLN: 25.0000 ug | INTRAMUSCULAR | Status: DC | PRN

## 2013-10-11 MED ORDER — KETOROLAC TROMETHAMINE 30 MG/ML IJ SOLN
30.0000 mg | Freq: Once | INTRAMUSCULAR | Status: AC
  Administered 2013-10-11: 30 mg via INTRAVENOUS

## 2013-10-11 MED ORDER — MIDAZOLAM HCL 2 MG/2ML IJ SOLN
INTRAMUSCULAR | Status: AC
  Filled 2013-10-11: qty 2

## 2013-10-11 MED ORDER — FENTANYL CITRATE 0.05 MG/ML IJ SOLN
INTRAMUSCULAR | Status: DC | PRN
  Administered 2013-10-11 (×4): 50 ug via INTRAVENOUS

## 2013-10-11 MED ORDER — SUCCINYLCHOLINE CHLORIDE 20 MG/ML IJ SOLN
INTRAMUSCULAR | Status: DC | PRN
  Administered 2013-10-11: 120 mg via INTRAVENOUS

## 2013-10-11 SURGICAL SUPPLY — 26 items
BAG HAMPER (MISCELLANEOUS) ×3 IMPLANT
BNDG CONFORM 2 STRL LF (GAUZE/BANDAGES/DRESSINGS) IMPLANT
CLOTH BEACON ORANGE TIMEOUT ST (SAFETY) ×3 IMPLANT
COVER LIGHT HANDLE STERIS (MISCELLANEOUS) ×6 IMPLANT
ELECT REM PT RETURN 9FT ADLT (ELECTROSURGICAL) ×3
ELECTRODE REM PT RTRN 9FT ADLT (ELECTROSURGICAL) ×1 IMPLANT
GAUZE IODOFORM PACK 1/2 7832 (GAUZE/BANDAGES/DRESSINGS) ×3 IMPLANT
GAUZE SPONGE 4X4 12PLY STRL (GAUZE/BANDAGES/DRESSINGS) IMPLANT
GLOVE BIOGEL M STRL SZ7.5 (GLOVE) ×6 IMPLANT
GLOVE BIOGEL PI IND STRL 7.5 (GLOVE) ×1 IMPLANT
GLOVE BIOGEL PI INDICATOR 7.5 (GLOVE) ×2
GLOVE EXAM NITRILE MD LF STRL (GLOVE) ×3 IMPLANT
GLOVE SURG SS PI 7.5 STRL IVOR (GLOVE) ×6 IMPLANT
GOWN STRL REUS W/TWL LRG LVL3 (GOWN DISPOSABLE) ×3 IMPLANT
KIT ROOM TURNOVER APOR (KITS) ×3 IMPLANT
MANIFOLD NEPTUNE II (INSTRUMENTS) ×3 IMPLANT
MARKER SKIN DUAL TIP RULER LAB (MISCELLANEOUS) ×3 IMPLANT
NS IRRIG 1000ML POUR BTL (IV SOLUTION) ×3 IMPLANT
PACK BASIC LIMB (CUSTOM PROCEDURE TRAY) IMPLANT
PACK MINOR (CUSTOM PROCEDURE TRAY) ×3 IMPLANT
PAD ABD 5X9 TENDERSORB (GAUZE/BANDAGES/DRESSINGS) IMPLANT
PAD ARMBOARD 7.5X6 YLW CONV (MISCELLANEOUS) ×3 IMPLANT
SET BASIN LINEN APH (SET/KITS/TRAYS/PACK) ×3 IMPLANT
SWAB COLLECTION DEVICE MRSA (MISCELLANEOUS) ×3 IMPLANT
SWAB CULTURE LIQ STUART DBL (MISCELLANEOUS) ×3 IMPLANT
SYR BULB IRRIGATION 50ML (SYRINGE) ×3 IMPLANT

## 2013-10-11 NOTE — Op Note (Signed)
Patient:  Dallas BreedingRichard Ogden  DOB:  [birth year]  MRN:  [id number]   Preop Diagnosis:  Perirectal abscess  Postop Diagnosis:  Same  Procedure:  Incision and drainage of perirectal abscess  Surgeon:  Franky MachoMark Casimiro Lienhard, M.D.  Anes:  General  Indications:  Patient is a [age] year old white male who presents to the emergency room with worsening perirectal pain. CT scan the abdomen and pelvis revealed a developing perirectal abscess on the left side of his anus. The risks and benefits of the procedure were fully explained to the patient, gave informed consent.  Procedure note:  The patient was placed in the lithotomy position after general anesthesia was administered. The perineum was prepped and draped using usual sterile technique with Betadine. Surgical site confirmation was performed.  A firm fluctuant area was noted at the 3:00 position in the perianal region. An incision was made and purulent fluid was found. Aerobic and anaerobic cultures were taken and sent to microbiology. The was no connection into the rectum. This appeared to be localized to the submucosal layer, but not into the muscle. The abscess cavity was copiously irrigated normal saline. 0.5% Sensorcaine was instilled in the surrounding region. Iodoform new gauze was then packed into the wound.  All tape and needle counts were correct at the end of the procedure. Patient was awakened and transferred to PACU in stable condition.  Complications:  None  EBL:  Minimal  Specimen:  Aerobic and anaerobic cultures.

## 2013-10-11 NOTE — Anesthesia Postprocedure Evaluation (Signed)
  Anesthesia Post-op Note  Patient: Randy Schroeder  Procedure(s) Performed: Procedure(s): INCISION AND DRAINAGE PERI-RECTAL ABSCESS (N/A)  Patient Location: PACU  Anesthesia Type:General  Level of Consciousness: awake, alert  and oriented  Airway and Oxygen Therapy: Patient Spontanous Breathing and Patient connected to face mask oxygen  Post-op Pain: none  Post-op Assessment: Post-op Vital signs reviewed, Patient's Cardiovascular Status Stable, Respiratory Function Stable, Patent Airway and No signs of Nausea or vomiting  Post-op Vital Signs: Reviewed and stable  Last Vitals:  Filed Vitals:   10/11/13 0935  BP: 110/82  Pulse:   Temp:   Resp: 31    Complications: No apparent anesthesia complications

## 2013-10-11 NOTE — Transfer of Care (Signed)
Immediate Anesthesia Transfer of Care Note  Patient: Randy Schroeder  Procedure(s) Performed: Procedure(s): INCISION AND DRAINAGE PERI-RECTAL ABSCESS (N/A)  Patient Location: PACU  Anesthesia Type:General  Level of Consciousness: awake, alert  and oriented  Airway & Oxygen Therapy: Patient Spontanous Breathing and Patient connected to face mask oxygen  Post-op Assessment: Report given to PACU RN  Post vital signs: Reviewed and stable  Complications: No apparent anesthesia complications

## 2013-10-11 NOTE — Anesthesia Preprocedure Evaluation (Signed)
Anesthesia Evaluation  Patient identified by MRN, date of birth, ID band Patient awake    Reviewed: Allergy & Precautions, H&P , NPO status , Patient's Chart, lab work & pertinent test results  Airway Mallampati: I TM Distance: >3 FB Neck ROM: Full    Dental  (+) Teeth Intact   Pulmonary Current Smoker (am cough),  breath sounds clear to auscultation        Cardiovascular negative cardio ROS  Rhythm:Regular Rate:Normal     Neuro/Psych    GI/Hepatic hiatal hernia, GERD-  Medicated and Controlled,  Endo/Other    Renal/GU      Musculoskeletal   Abdominal   Peds  Hematology   Anesthesia Other Findings   Reproductive/Obstetrics                           Anesthesia Physical Anesthesia Plan  ASA: II  Anesthesia Plan: General   Post-op Pain Management:    Induction: Intravenous, Rapid sequence and Cricoid pressure planned  Airway Management Planned: Oral ETT  Additional Equipment:   Intra-op Plan:   Post-operative Plan: Extubation in OR  Informed Consent: I have reviewed the patients History and Physical, chart, labs and discussed the procedure including the risks, benefits and alternatives for the proposed anesthesia with the patient or authorized representative who has indicated his/her understanding and acceptance.     Plan Discussed with:   Anesthesia Plan Comments:         Anesthesia Quick Evaluation

## 2013-10-11 NOTE — Progress Notes (Signed)
UR completed 

## 2013-10-12 LAB — CBC
HEMATOCRIT: 38.5 % — AB (ref 39.0–52.0)
HEMOGLOBIN: 13.4 g/dL (ref 13.0–17.0)
MCH: 29.3 pg (ref 26.0–34.0)
MCHC: 34.8 g/dL (ref 30.0–36.0)
MCV: 84.2 fL (ref 78.0–100.0)
Platelets: 330 10*3/uL (ref 150–400)
RBC: 4.57 MIL/uL (ref 4.22–5.81)
RDW: 14.1 % (ref 11.5–15.5)
WBC: 15.9 10*3/uL — AB (ref 4.0–10.5)

## 2013-10-12 MED ORDER — METRONIDAZOLE 250 MG PO TABS: 250.0000 mg | ORAL_TABLET | Freq: Three times a day (TID) | ORAL | Status: AC

## 2013-10-12 MED ORDER — OXYCODONE-ACETAMINOPHEN 7.5-325 MG PO TABS: 1.0000 | ORAL_TABLET | ORAL | Status: AC | PRN

## 2013-10-12 MED ORDER — CIPROFLOXACIN HCL 500 MG PO TABS: 500.0000 mg | ORAL_TABLET | Freq: Two times a day (BID) | ORAL | Status: AC

## 2013-10-12 NOTE — Discharge Instructions (Signed)
Perianal Abscess °An abscess is an infected area that contains a collection of pus and debris. A perianal abscess is one that occurs in the perineal area, which is the area between the anus and the scrotum in males and between the anus and the vagina in females. Perianal abscesses can vary in size. Without treatment, a perianal abscess can become larger and cause other problems. °CAUSES  °Glands in the perineal area can become plugged up with debris. When this happens, an abscess may form.  °SIGNS AND SYMPTOMS  °The most common symptoms of a perianal abscess are: °· Swelling and redness in the area of the abscess. The redness may go beyond the abscess and appear as a red streak on the skin. °· Pain in the area of the abscess. °Other possible symptoms include:  °· A visible lump or a lump that can be felt when touching the area and is usually painful. °· Bleeding or pus-like discharge from the area. °· Fever. °· General weakness. °DIAGNOSIS  °Your health care provider will take a medical history and examine the area. This may involve examining the rectal area with a gloved hand (digital rectal exam). For women, it may require a careful vaginal exam. Sometimes, the health care provider needs to look into the rectum using a probe or scope. °TREATMENT  °Treatment often requires making a cut (incision) in the abscess to drain the pus. This can sometimes be done in your health care provider's office or an emergency department after giving you medicine to numb the area (local anesthetic). For larger or deeper abscesses, surgery may be required to drain the abscess. Antibiotic medicines are sometimes given if there is infection of the surrounding tissue (cellulitis). In some cases, gauze is packed into the abscess to continue draining the area. Frequent sitz baths may be recommended to help the wound heal and to reduce the chance of the abscess coming back. °HOME CARE INSTRUCTIONS  °· Only take over-the-counter or  prescription medicines for pain, fever, or discomfort as directed by your health care provider. °· Take antibiotic medicine as directed. Make sure you finish it even if you start to feel better. °· If gauze is used in the abscess, follow your health care provider's instructions for removing or changing the gauze. It can usually be removed in 2-3 days. °· If one or more drains have been placed in the abscess cavity, be careful not to pull at them. Your health care provider will tell you how long they need to remain in place. °· Take warm sitz baths 3-4 times a day and after bowel movements. This will help reduce pain and swelling. °· Keep the skin around the abscess clean and dry. Avoid cleaning the area too much. °· Avoid scratching the abscess area. °· Avoid using colored or perfumed toilet papers. °SEEK MEDICAL CARE IF:  °· You have trouble having a bowel movement or passing urine. °· Your pain or swelling in the affected area does not seem to be improving. °· The gauze packing or the drains come out before the planned time. °SEEK IMMEDIATE MEDICAL CARE IF:  °· You have problems moving or using your legs. °· You have severe or increasing pain. °· Your swelling in the affected area suddenly gets worse. °· You have a large increase in bleeding or passing of pus. °· You have chills or a fever. °MAKE SURE YOU:  °· Understand these instructions. °· Will watch your condition. °· Will get help right away if you are   not doing well or get worse. °Document Released: 01/30/2006 Document Revised: 10/14/2012 Document Reviewed: 08/05/2012 °[employer]® Patient Information ©2015 [employer]. This information is not intended to replace advice given to you by your health care provider. Make sure you discuss any questions you have with your health care provider. ° °

## 2013-10-12 NOTE — Discharge Summary (Signed)
Physician Discharge Summary  Patient ID: Randy BreedingRichard Schroeder MRN: [id number] DOB/AGE: [birth year] 38 y.o.  Admit date: 10/10/2013 Discharge date: 10/12/2013  Admission Diagnoses: Perirectal abscess  Discharge Diagnoses: Same Active Problems:   Perirectal abscess   Discharged Condition: good  Hospital Course: Patient is a 38 year old white male who presented with a four-day history of worsening perirectal pain. CT scan the abdomen revealed a perirectal abscess. He underwent incision and drainage of the perirectal abscess on 10/11/2013. He tolerated the procedure well. His postoperative course has been unremarkable. His diet was advanced at difficulty. Cultures are still pending. His leukocytosis is normalizing. He is being discharged home in good and improving condition.  Treatments: surgery: Incision and drainage of perirectal abscess on 10/11/2013  Discharge Exam: Blood pressure 104/63, pulse 71, temperature 98.9 F (37.2 C), temperature source Oral, resp. rate 18, height 5\' 10"  (1.778 m), weight 95.2 kg (209 lb 14.1 oz), SpO2 97.00%. General appearance: alert, cooperative and no distress Resp: clear to auscultation bilaterally Cardio: regular rate and rhythm, S1, S2 normal, no murmur, click, rub or gallop Pelvic: Packing removed. Serosanguineous drainage noted.  Disposition: 01-Home or Self Care     Medication List         acetaminophen 500 MG tablet  Commonly known as:  TYLENOL  Take 1,000 mg by mouth every 6 (six) hours as needed for moderate pain.     ciprofloxacin 500 MG tablet  Commonly known as:  CIPRO  Take 1 tablet (500 mg total) by mouth 2 (two) times daily.     diazepam 2 MG tablet  Commonly known as:  VALIUM  Take 1 tablet by mouth at bedtime.     FLINTSTONES COMPLETE PO  Take by mouth.     metroNIDAZOLE 250 MG tablet  Commonly known as:  FLAGYL  Take 1 tablet (250 mg total) by mouth 3 (three) times daily.     multivitamin with minerals Tabs tablet  Take 1  tablet by mouth every morning.     oxyCODONE-acetaminophen 7.5-325 MG per tablet  Commonly known as:  PERCOCET  Take 1-2 tablets by mouth every 4 (four) hours as needed.           Follow-up Information   Follow up with Dalia HeadingJENKINS,Deondra Wigger A, MD. Schedule an appointment as soon as possible for a visit on 10/19/2013.   Specialty:  General Surgery   Contact information:   [address] [place] [id number] [phone]       Signed: Franky MachoJENKINS,Sumaiyah Markert A 10/12/2013, 8:13 AM

## 2013-10-12 NOTE — Progress Notes (Signed)
Patient discharged home with family.  IV removed - WNL.  Patient instructed on wound care and new medications.  Emphasized importance of completing whole dose of cipro and flagyl.  Instructed on s/s of infection and when to call MD.  Randy GammonVerbalizes understanding with teachback. Follow up in place.  Advised and given education on smoking cessation.  Refuses flu vaccine at this time - states he would rather wait and have done at MD's office when he is feeling better.  No questions at this time - stable to DC home.  Left floor via WC with NT assist

## 2013-10-13 LAB — CULTURE, ROUTINE-ABSCESS

## 2013-10-14 ENCOUNTER — Encounter (HOSPITAL_COMMUNITY): Payer: Self-pay | Admitting: General Surgery

## 2013-10-15 LAB — CULTURE, BLOOD (SINGLE): Culture: NO GROWTH

## 2013-10-16 LAB — ANAEROBIC CULTURE

## 2014-03-22 IMAGING — CR DG CHEST 2V
2 series · 2 of 2 positions shown · non-contrast
Comparison: 04/18/2009.

CLINICAL DATA: Electrical shock.

CHEST - 2 VIEW

[view not recorded (1 of 2)]
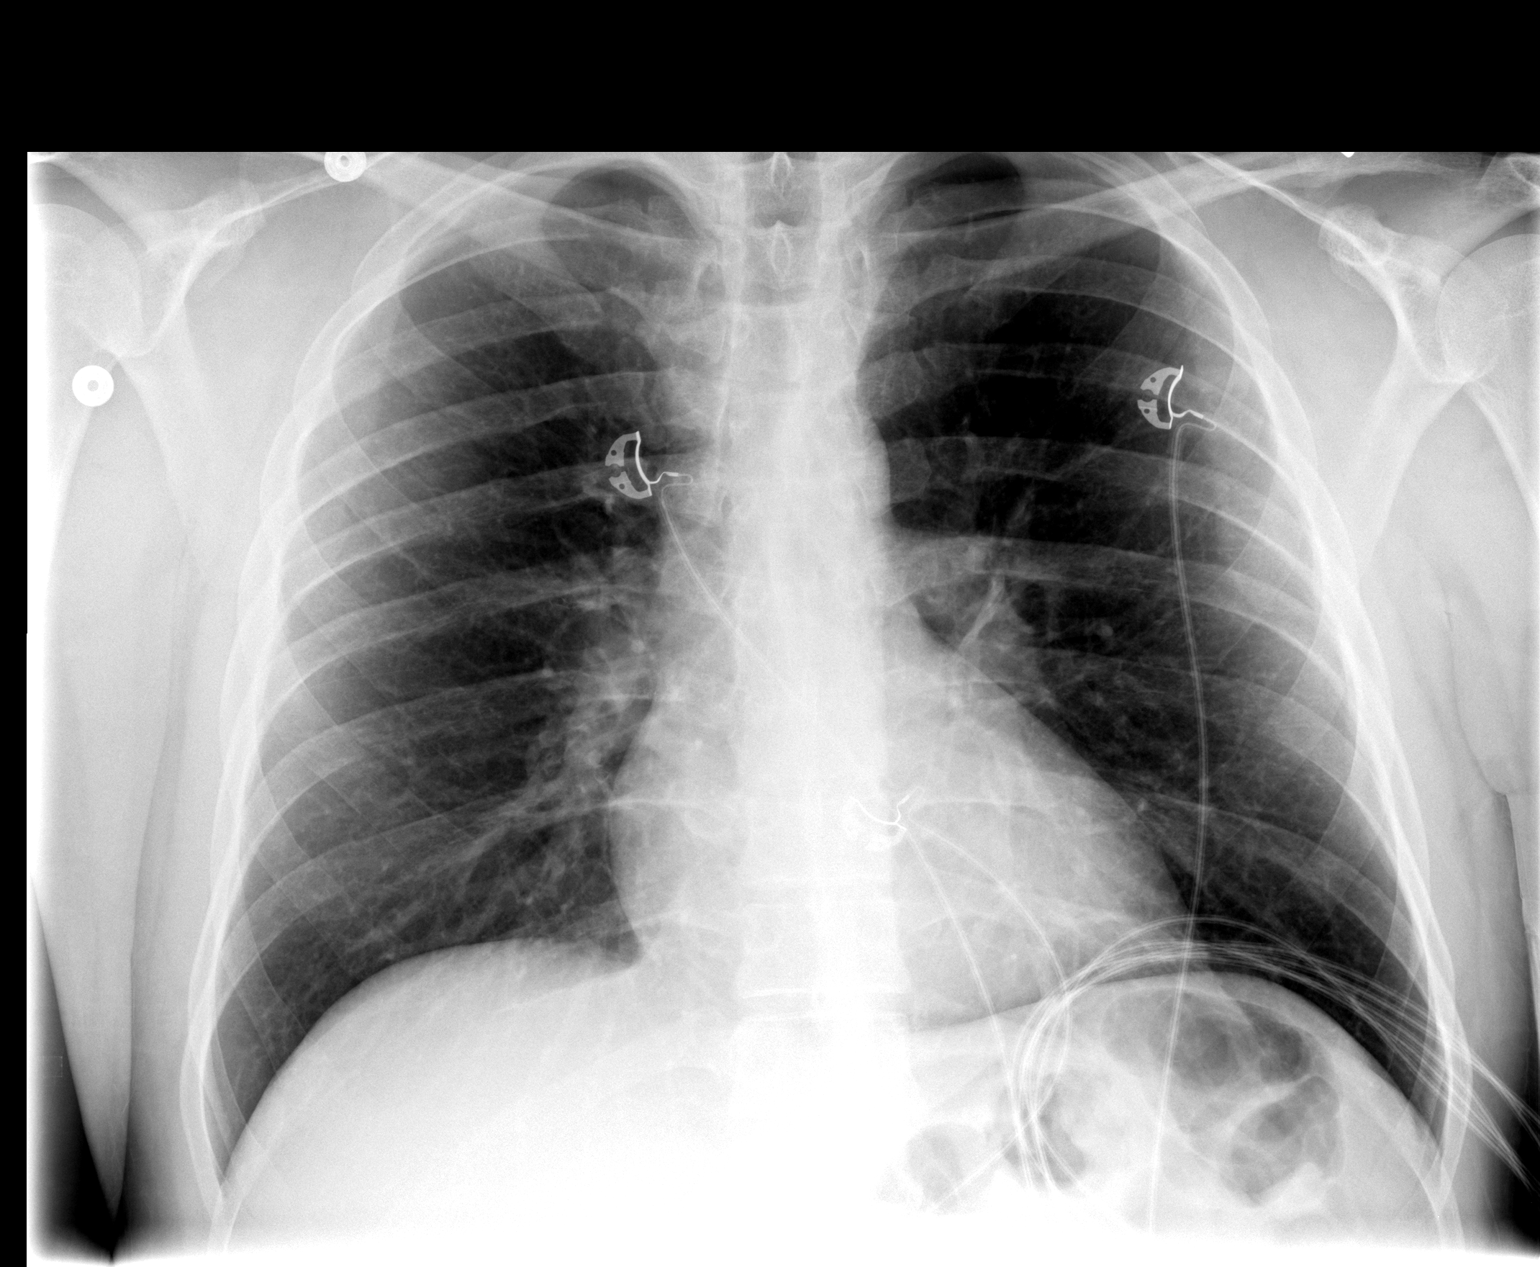

[view not recorded (2 of 2)]
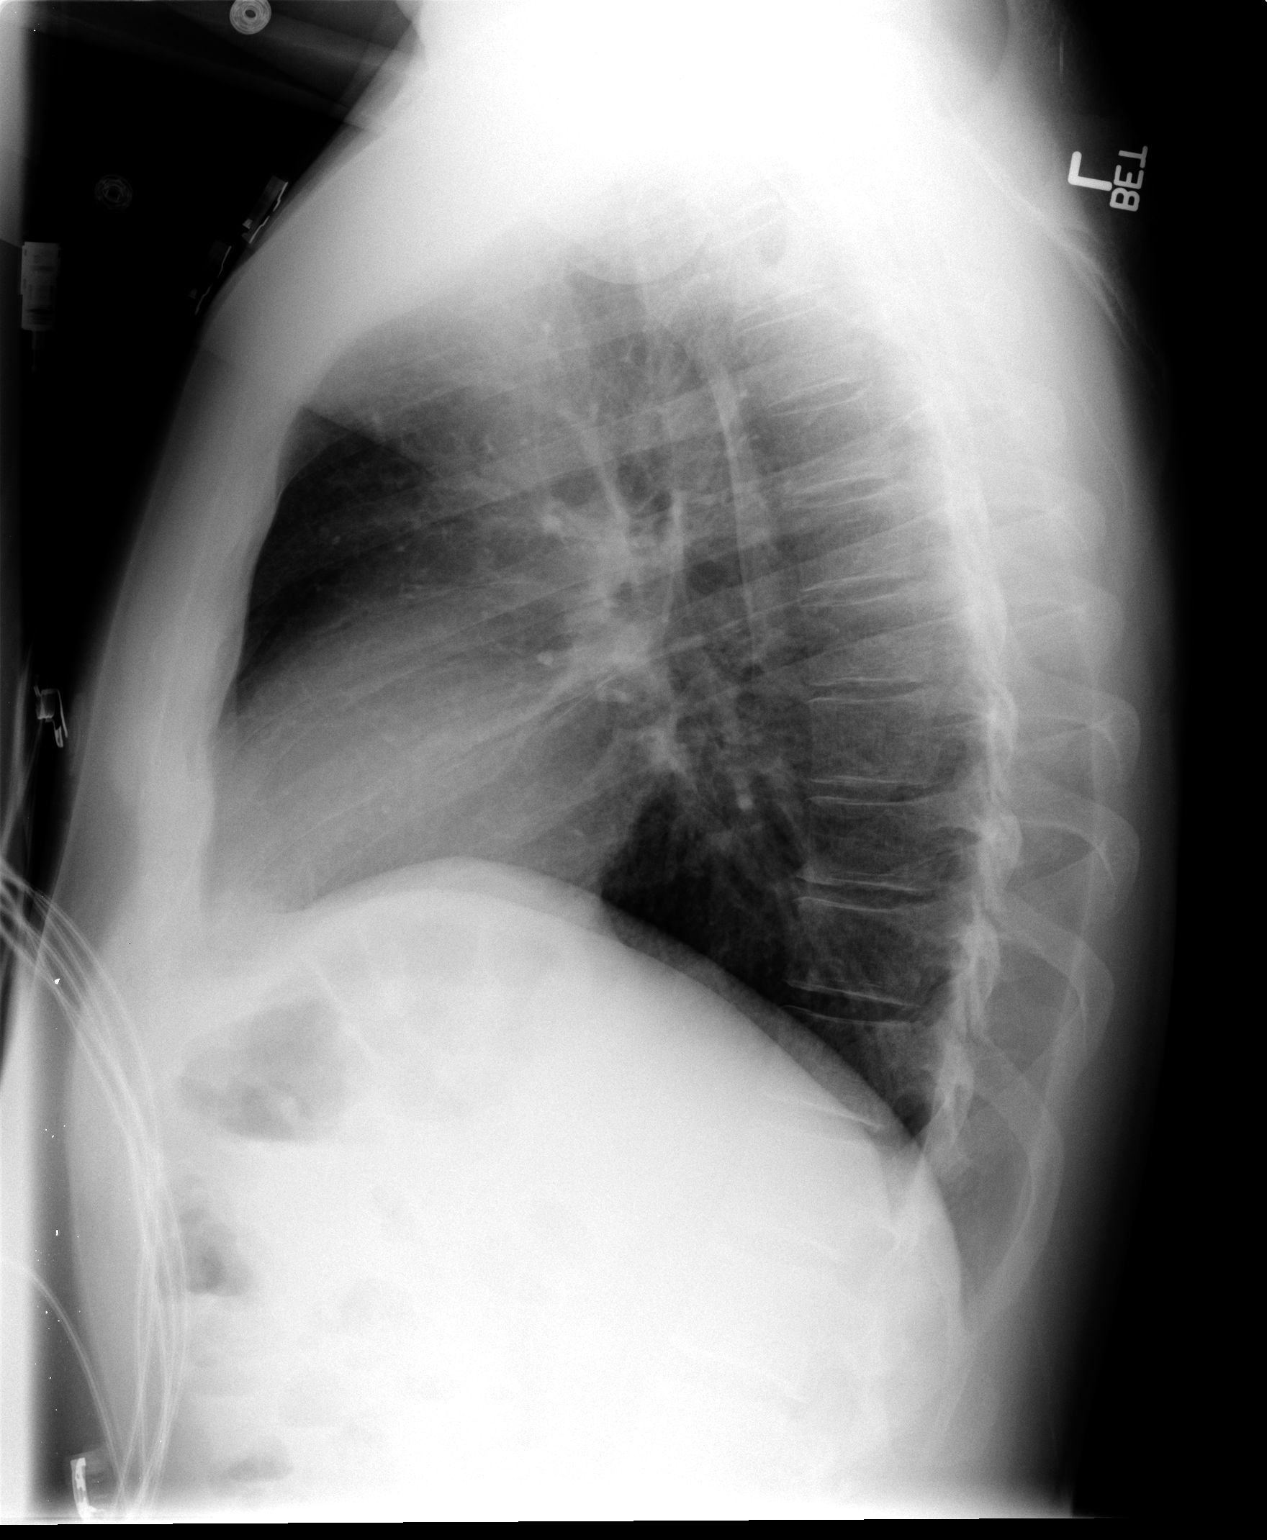

[2 of 2 positions shown; findings below may reference images not displayed]

FINDINGS: The cardiac silhouette, mediastinal and hilar contours
are within normal limits and stable. The lungs are clear.  No
pleural effusions.  The bony thorax is intact.
IMPRESSION: Normal chest x-ray.

## 2014-12-25 IMAGING — CT CT ABD-PELV W/ CM
2 of 5 series · 14 of 46 positions shown, 16 images · IV contrast (Omnipaque 300)
Comparison: None.

CLINICAL DATA: Severe rectal pain with suspected abscess. First
observed approximately 5 days ago though has worsened. Fever,
chills, nausea and vomiting. Initial encounter.

EXAM:
CT ABDOMEN AND PELVIS WITH CONTRAST
TECHNIQUE: Multidetector CT imaging of the abdomen and pelvis was performed
using the standard protocol following bolus administration of
intravenous contrast.
CONTRAST:  50mL OMNIPAQUE IOHEXOL 300 MG/ML SOLN, 100mL OMNIPAQUE
IOHEXOL 300 MG/ML SOLN

[Series 2: abd_pel_with 5.0 b40f · axial · 0.72mm/px · z∈[-608,-113]mm · 11 of 117 slices shown, 13 images]
[im 9/117  soft-tissue]
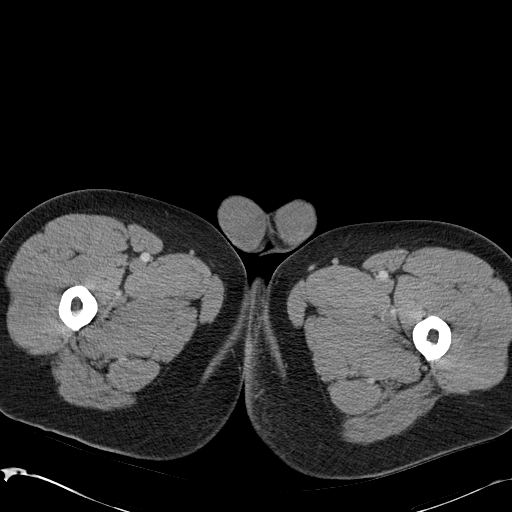
[im 9/117  bone]
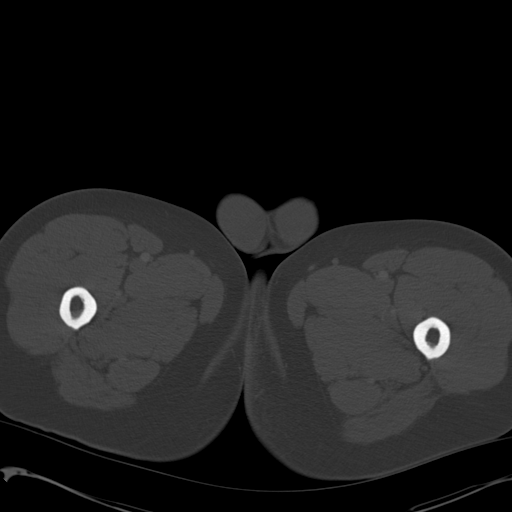
[im 18/117  soft-tissue]
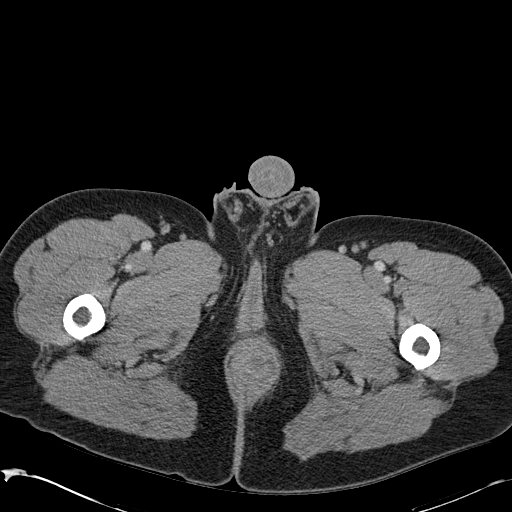
[im 27/117  soft-tissue]
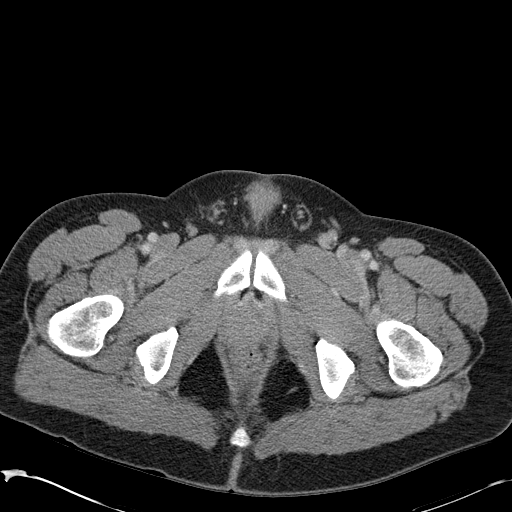
[im 36/117  soft-tissue]
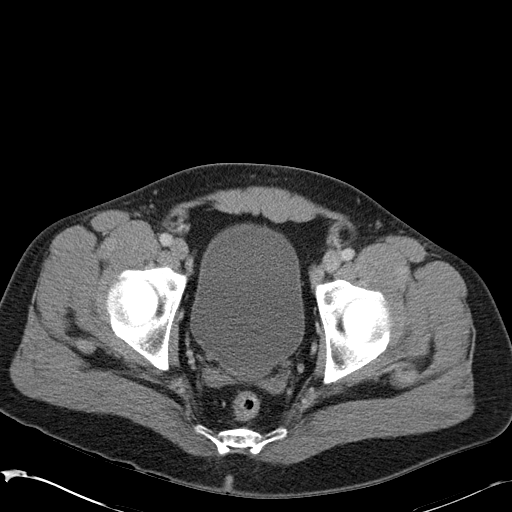
[im 45/117  soft-tissue]
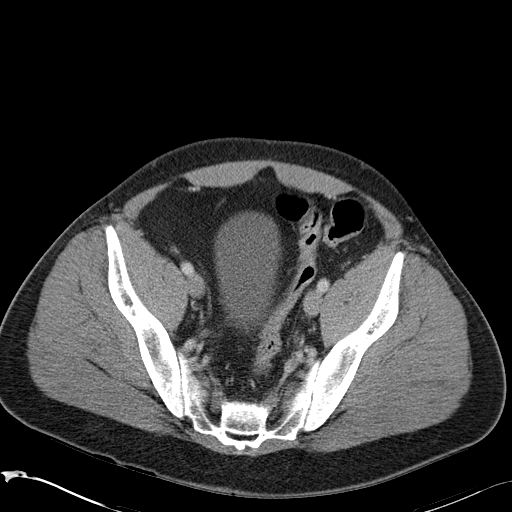
[im 63/117  soft-tissue]
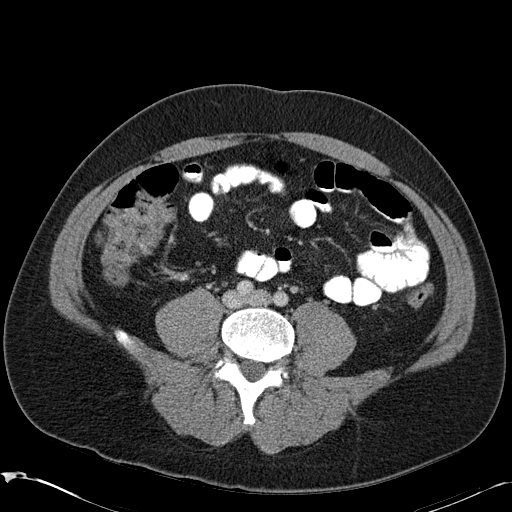
[im 72/117  soft-tissue]
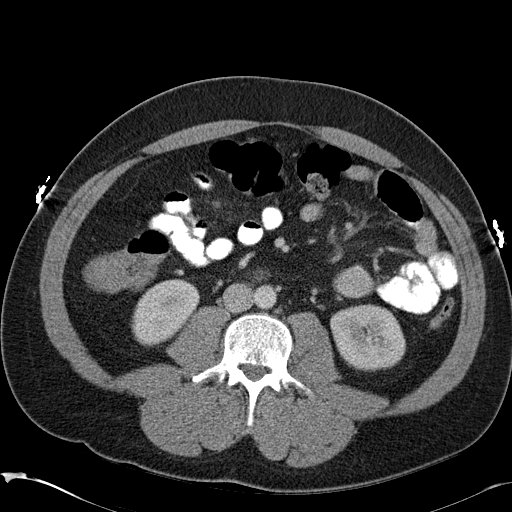
[im 81/117  soft-tissue]
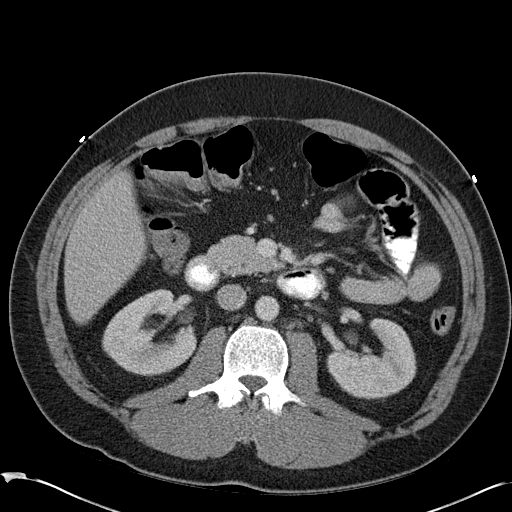
[im 90/117  soft-tissue]
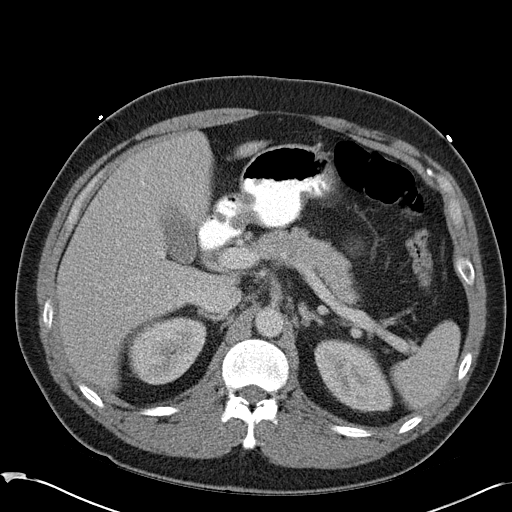
[im 90/117  bone]
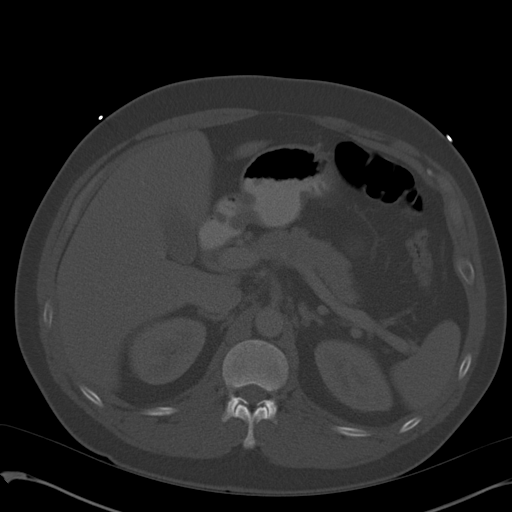
[im 99/117  soft-tissue]
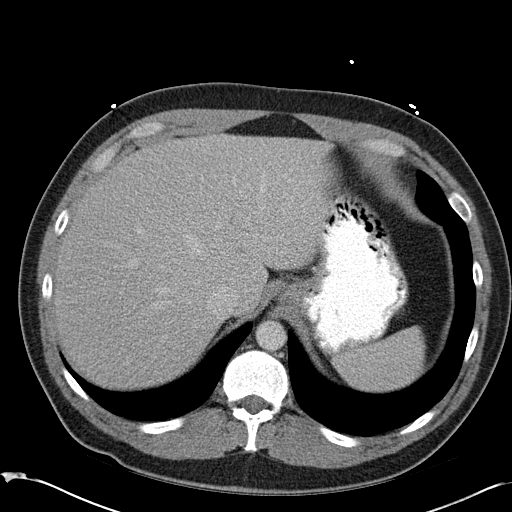
[im 108/117  soft-tissue]
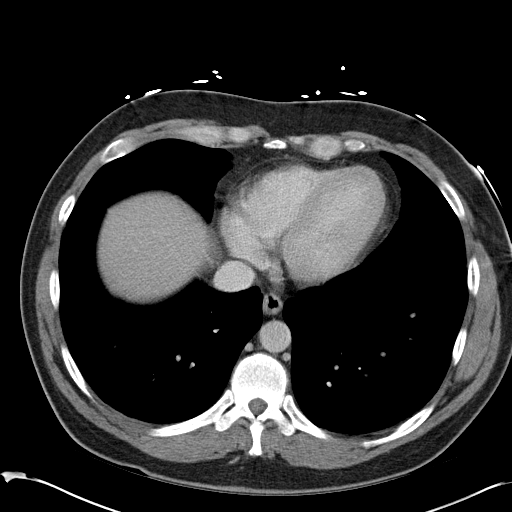

[Series 3: abd_pel_with 3.0 spo · coronal · 0.77mm/px · 3 of 104 slices shown]
[im 35/104  soft-tissue]
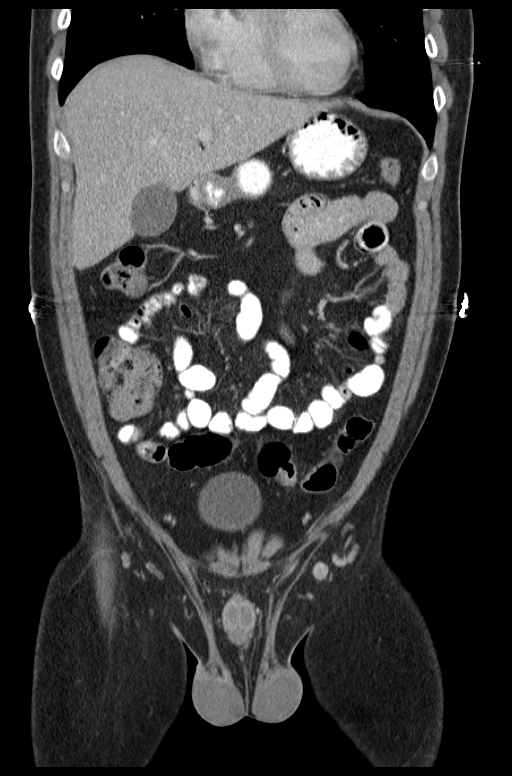
[im 46/104  soft-tissue]
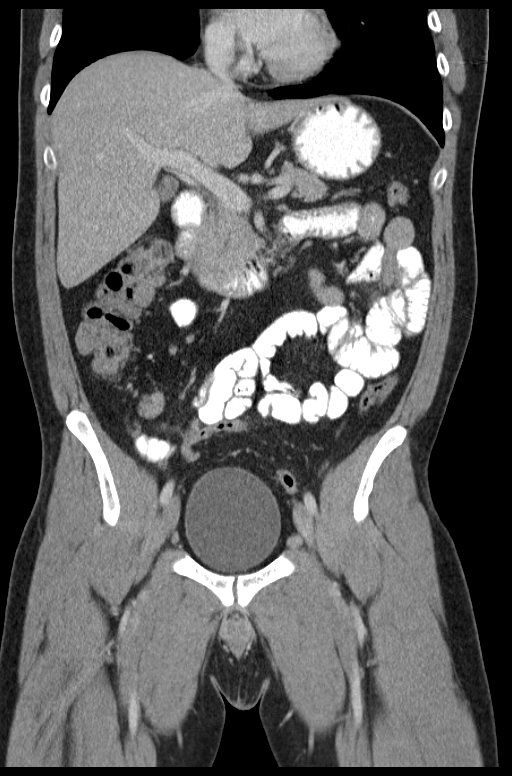
[im 58/104  soft-tissue]
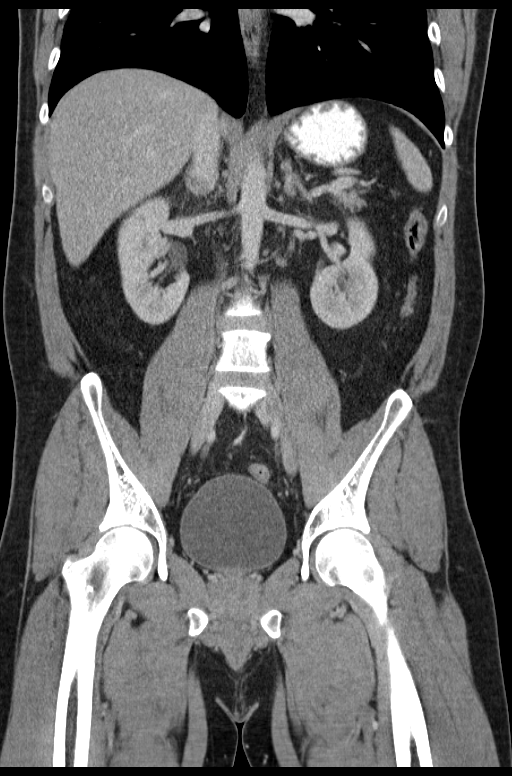

[14 of 46 positions shown; findings below may reference images not displayed]

FINDINGS: There is an approximately 3.1 x 2.0 x 3.5 cm peripherally enhancing
abscess about the left side of the anus (as measured in greatest
oblique axial - image 101, series 2 and coronal- image 69, series 5)
dimensions respectively. There is a tiny focus of subcutaneous
emphysema within the nondependent portion of this fluid collection
(representative axial images 99, series 2 and coronal image 69,
series 6). This finding is associated with a minimal amount of
adjacent subcutaneous stranding.

The bowel is otherwise normal in course and caliber without wall
thickening. Enteric contrast extends to the level of distal small
bowel. No evidence of enteric obstruction. Normal appearance of the
appendix. No pneumoperitoneum, pneumatosis or portal venous gas.

Normal hepatic contour. There is a minimal amount of focal fatty
infiltration adjacent to the fissure for the ligamentum teres.
Normal appearance of the gallbladder. No radiopaque gallstones. No
intra or extrahepatic biliary duct dilatation. No ascites.

There is symmetric enhancement and excretion of the bilateral
kidneys. No definite renal stones on this postcontrast examination.
Note is made of a subcentimeter hypoattenuating lesion within the
superior pole the right kidney (coronal image 77, series 3) which is
too small to adequately characterize of favored to represent a renal
cyst. No urinary obstruction or perinephric stranding. Normal
appearance of the bilateral adrenal glands, pancreas and spleen.
Incidental note is made of a small splenule.

Normal caliber the abdominal aorta. The major branch vessels of the
abdominal aorta appear widely patent on this non CTA examination.
Incidental note is made of a duplicated right renal artery with a
tiny accessory right renal artery which supplies the inferior pole
of the right kidney.

No retroperitoneal, mesenteric, pelvic or inguinal lymphadenopathy.

Normal appearance of the prostate. Normal appearance of the urinary
bladder given degree of distention. No free fluid in the pelvic
cul-de-sac.

Limited visualization of the lower thorax demonstrates minimal
grossly symmetric ground-glass atelectasis. No discrete focal
airspace opacities.

Normal heart size.  No pericardial effusion.

No acute or aggressive osseous abnormalities.

Regional soft tissues appear normal.
IMPRESSION: Small (approximately 3.5 cm) perirectal abscess about the left side
of the anus with associated tiny focus of subcutaneous emphysema.
Correlation with direct visualization is recommended.

## 2016-06-06 ENCOUNTER — Ambulatory Visit: Payer: Self-pay | Admitting: General Surgery

## 2016-09-26 ENCOUNTER — Encounter: Payer: Self-pay | Admitting: General Surgery

## 2016-09-26 ENCOUNTER — Ambulatory Visit (INDEPENDENT_AMBULATORY_CARE_PROVIDER_SITE_OTHER): Payer: BLUE CROSS/BLUE SHIELD | Admitting: General Surgery

## 2016-09-26 VITALS — BP 130/88 | HR 86 | Temp 99.0°F | Resp 18 | Ht 70.0 in | Wt 210.0 lb

## 2016-09-26 DIAGNOSIS — K603 Anal fistula: Secondary | ICD-10-CM

## 2016-09-26 NOTE — H&P (Signed)
Randy Schroeder; [id number]; [birth year]   HPI Patient is a 41 year old white male who referred himself to my care for a follow-up of a perianal cyst. He is status post I&D of the perianal abscess in 2015. He states that recently he has started to recur. It has been draining intermittently. He denies any air or stool through the opening. He currently has no pain. He has some constipation, but no diarrhea. No fever or chills have been noted. Past Medical History:  Diagnosis Date  . GERD (gastroesophageal reflux disease)   . Hiatal hernia    Reported by patient 04/08/2012    Past Surgical History:  Procedure Laterality Date  . INCISION AND DRAINAGE ABSCESS N/A 10/11/2013   Procedure: INCISION AND DRAINAGE PERI-RECTAL ABSCESS;  Surgeon: Dalia Heading, MD;  Location: [facility];  Service: General;  Laterality: N/A;    History reviewed. No pertinent family history.  Current Outpatient Prescriptions on File Prior to Visit  Medication Sig Dispense Refill  . acetaminophen (TYLENOL) 500 MG tablet Take 1,000 mg by mouth every 6 (six) hours as needed for moderate pain.    . ciprofloxacin (CIPRO) 500 MG tablet Take 1 tablet (500 mg total) by mouth 2 (two) times daily. 14 tablet 0  . diazepam (VALIUM) 2 MG tablet Take 1 tablet by mouth at bedtime.     . metroNIDAZOLE (FLAGYL) 250 MG tablet Take 1 tablet (250 mg total) by mouth 3 (three) times daily. 21 tablet 0  . Multiple Vitamin (MULTIVITAMIN WITH MINERALS) TABS Take 1 tablet by mouth every morning.    Marland Kitchen oxyCODONE-acetaminophen (PERCOCET) 7.5-325 MG per tablet Take 1-2 tablets by mouth every 4 (four) hours as needed. 50 tablet 0  . Pediatric Multivit-Minerals-C (FLINTSTONES COMPLETE PO) Take by mouth.       No current facility-administered medications on file prior to visit.     Allergies  Allergen Reactions  . Bee Venom Anaphylaxis    History  Alcohol Use No    History  Smoking Status  . Current Every Day Smoker  Smokeless Tobacco  . Never  Used    Review of Systems  Constitutional: Negative.   HENT: Negative.   Eyes: Negative.   Respiratory: Negative.   Cardiovascular: Negative.   Gastrointestinal: Negative.   Genitourinary: Negative.   Musculoskeletal: Negative.   Skin: Negative.   Neurological: Negative.   Endo/Heme/Allergies: Negative.   Psychiatric/Behavioral: Negative.     Objective   Vitals:   09/26/16 1137  BP: 130/88  Pulse: 86  Resp: 18  Temp: 99 F (37.2 C)    Physical Exam  Constitutional: He is oriented to person, place, and time and well-developed, well-nourished, and in no distress.  HENT:  Head: Normocephalic and atraumatic.  Cardiovascular: Normal rate, regular rhythm and normal heart sounds.  Exam reveals no gallop and no friction rub.   No murmur heard. Pulmonary/Chest: Effort normal. No respiratory distress. He has no wheezes. He has no rales.  Abdominal: Soft. Bowel sounds are normal. He exhibits no distension.  Genitourinary:  Genitourinary Comments: Rectal examination reveals an indurated area at the 2:00 position of the anus. No active drainage noted. No mass noted on rectal examination. Sphincter tone is normal.  Neurological: He is alert and oriented to person, place, and time.  Skin: Skin is warm and dry.  Vitals reviewed.   Assessment  Recurrent perianal abscess, possible anal fistula Plan   Patient scheduled for anal fistulotomy on 10/11/2016. The risks and benefits of the procedure including bleeding, infection,  recurrence of the fistula, and the possibility of sphincter dysfunction were fully explained to the patient, who gave informed consent.

## 2016-09-26 NOTE — Progress Notes (Signed)
Randy Schroeder; [id number]; 10/18/1975   HPI Patient is a 41-year-old white male who referred himself to my care for a follow-up of a perianal cyst. He is status post I&D of the perianal abscess in 2015. He states that recently he has started to recur. It has been draining intermittently. He denies any air or stool through the opening. He currently has no pain. He has some constipation, but no diarrhea. No fever or chills have been noted. Past Medical History:  Diagnosis Date  . GERD (gastroesophageal reflux disease)   . Hiatal hernia    Reported by patient 04/08/2012    Past Surgical History:  Procedure Laterality Date  . INCISION AND DRAINAGE ABSCESS N/A 10/11/2013   Procedure: INCISION AND DRAINAGE PERI-RECTAL ABSCESS;  Surgeon: Mirha Brucato A Kamarah Bilotta, MD;  Location: [facility];  Service: General;  Laterality: N/A;    History reviewed. No pertinent family history.  Current Outpatient Prescriptions on File Prior to Visit  Medication Sig Dispense Refill  . acetaminophen (TYLENOL) 500 MG tablet Take 1,000 mg by mouth every 6 (six) hours as needed for moderate pain.    . ciprofloxacin (CIPRO) 500 MG tablet Take 1 tablet (500 mg total) by mouth 2 (two) times daily. 14 tablet 0  . diazepam (VALIUM) 2 MG tablet Take 1 tablet by mouth at bedtime.     . metroNIDAZOLE (FLAGYL) 250 MG tablet Take 1 tablet (250 mg total) by mouth 3 (three) times daily. 21 tablet 0  . Multiple Vitamin (MULTIVITAMIN WITH MINERALS) TABS Take 1 tablet by mouth every morning.    . oxyCODONE-acetaminophen (PERCOCET) 7.5-325 MG per tablet Take 1-2 tablets by mouth every 4 (four) hours as needed. 50 tablet 0  . Pediatric Multivit-Minerals-C (FLINTSTONES COMPLETE PO) Take by mouth.       No current facility-administered medications on file prior to visit.     Allergies  Allergen Reactions  . Bee Venom Anaphylaxis    History  Alcohol Use No    History  Smoking Status  . Current Every Day Smoker  Smokeless Tobacco  . Never  Used    Review of Systems  Constitutional: Negative.   HENT: Negative.   Eyes: Negative.   Respiratory: Negative.   Cardiovascular: Negative.   Gastrointestinal: Negative.   Genitourinary: Negative.   Musculoskeletal: Negative.   Skin: Negative.   Neurological: Negative.   Endo/Heme/Allergies: Negative.   Psychiatric/Behavioral: Negative.     Objective   Vitals:   09/26/16 1137  BP: 130/88  Pulse: 86  Resp: 18  Temp: 99 F (37.2 C)    Physical Exam  Constitutional: He is oriented to person, place, and time and well-developed, well-nourished, and in no distress.  HENT:  Head: Normocephalic and atraumatic.  Cardiovascular: Normal rate, regular rhythm and normal heart sounds.  Exam reveals no gallop and no friction rub.   No murmur heard. Pulmonary/Chest: Effort normal. No respiratory distress. He has no wheezes. He has no rales.  Abdominal: Soft. Bowel sounds are normal. He exhibits no distension.  Genitourinary:  Genitourinary Comments: Rectal examination reveals an indurated area at the 2:00 position of the anus. No active drainage noted. No mass noted on rectal examination. Sphincter tone is normal.  Neurological: He is alert and oriented to person, place, and time.  Skin: Skin is warm and dry.  Vitals reviewed.   Assessment  Recurrent perianal abscess, possible anal fistula Plan   Patient scheduled for anal fistulotomy on 10/11/2016. The risks and benefits of the procedure including bleeding, infection,   recurrence of the fistula, and the possibility of sphincter dysfunction were fully explained to the patient, who gave informed consent.

## 2016-09-26 NOTE — Patient Instructions (Signed)
Anal Fistulotomy Anal fistulotomy is a surgical procedure to open and drain an anal fistula. An anal fistula is an abnormal tunnel that develops between the bowel and the skin near the outside of the anus, where stool (feces) comes out. During this procedure, the fistula is opened up and the contents are drained to promote healing. Tell a health care provider about:  Any allergies you have.  All medicines you are taking, including vitamins, herbs, eye drops, creams, and over-the-counter medicines.  Any problems you or family members have had with anesthetic medicines.  Any blood disorders you have.  Any surgeries you have had.  Any medical conditions you have.  Whether you are pregnant or may be pregnant. What are the risks? Generally, this is a safe procedure. However, problems may occur, including:  Infection.  Bleeding.  Allergic reactions to medicines or dyes.  Damage to other structures or organs.  Not being able to control when you have bowel movements (incontinence).  Being unable to empty your bladder (urinary retention).  What happens before the procedure? Staying hydrated Follow instructions from your health care provider about hydration, which may include:  Up to 2 hours before the procedure - you may continue to drink clear liquids, such as water, clear fruit juice, black coffee, and plain tea.  Eating and drinking restrictions Follow instructions from your health care provider about eating and drinking, which may include:  8 hours before the procedure - stop eating heavy meals or foods such as meat, fried foods, or fatty foods.  6 hours before the procedure - stop eating light meals or foods, such as toast or cereal.  6 hours before the procedure - stop drinking milk or drinks that contain milk.  2 hours before the procedure - stop drinking clear liquids.  Medicines  Ask your health care provider about: ? Changing or stopping your regular medicines.  This is especially important if you are taking diabetes medicines or blood thinners. ? Taking medicines such as aspirin and ibuprofen. These medicines can thin your blood. Do not take these medicines before your procedure if your health care provider instructs you not to.  You may be given antibiotic medicine to help prevent infection. General Instructions  You may have an exam or testing.  You may have a blood or urine sample taken.  You may be instructed to take a laxative or use an enema to clean your bowels before surgery.  Plan to have someone take you home from the hospital or clinic.  If you will be going home right after the procedure, plan to have someone with you for 24 hours. What happens during the procedure?  To reduce your risk of infection: ? Your health care team will wash or sanitize their hands. ? Your skin will be washed with soap.  An IV tube will be inserted into one of your veins to give you fluids and medicines.  You will be given one or more of the following: ? A medicine to help you relax (sedative). ? A medicine to numb the area (local anesthetic). ? A medicine to make you fall asleep (general anesthetic).  Your surgeon will locate the internal opening of your fistula.  An incision will be made in the fistula opening. The incision may extend into the muscles around the anus (sphincter muscles).  The fistula will be cut open and drained.  The sides of the fistula may be stitched (sutured) to the sides of the incision.  Gauze bandages (dressings)   may be placed inside of the fistula. The procedure may vary among health care providers and hospitals. What happens after the procedure?  Your blood pressure, heart rate, breathing rate, and blood oxygen level will be monitored until the medicines you were given have worn off.  You may have to wear compression stockings. These stockings help to prevent blood clots and reduce swelling in your legs.  Do not  drive for 24 hours if you received a sedative.  You may continue to receive fluids and medicines through an IV tube.  You may have some bleeding from the surgical area. You may have to wear an absorbent pad. This information is not intended to replace advice given to you by your health care provider. Make sure you discuss any questions you have with your health care provider. Document Released: 04/18/2015 Document Revised: 08/03/2015 Document Reviewed: 04/18/2015 Elsevier Interactive Patient Education  2018 [employer]  

## 2016-10-08 ENCOUNTER — Other Ambulatory Visit (HOSPITAL_COMMUNITY): Payer: 59

## 2016-10-08 ENCOUNTER — Other Ambulatory Visit: Payer: Self-pay | Admitting: Orthopedic Surgery

## 2016-10-08 DIAGNOSIS — M79672 Pain in left foot: Secondary | ICD-10-CM

## 2016-10-11 ENCOUNTER — Ambulatory Visit (HOSPITAL_COMMUNITY)
Admission: RE | Admit: 2016-10-11 | Payer: BLUE CROSS/BLUE SHIELD | Source: Ambulatory Visit | Admitting: General Surgery

## 2016-10-11 ENCOUNTER — Encounter (HOSPITAL_COMMUNITY): Admission: RE | Payer: Self-pay | Source: Ambulatory Visit

## 2016-10-11 SURGERY — FISTULOTOMY
Anesthesia: General

## 2016-10-22 ENCOUNTER — Other Ambulatory Visit: Payer: BLUE CROSS/BLUE SHIELD

## 2017-07-31 ENCOUNTER — Encounter (HOSPITAL_COMMUNITY): Payer: Self-pay | Admitting: Emergency Medicine

## 2017-07-31 ENCOUNTER — Other Ambulatory Visit: Payer: Self-pay

## 2017-07-31 ENCOUNTER — Emergency Department (HOSPITAL_COMMUNITY)
Admission: EM | Admit: 2017-07-31 | Discharge: 2017-07-31 | Disposition: A | Discharge status: Home / Self Care | Payer: BLUE CROSS/BLUE SHIELD | Attending: Emergency Medicine | Admitting: Emergency Medicine

## 2017-07-31 DIAGNOSIS — T63441A Toxic effect of venom of bees, accidental (unintentional), initial encounter: Secondary | ICD-10-CM | POA: Insufficient documentation

## 2017-07-31 DIAGNOSIS — Z91030 Bee allergy status: Secondary | ICD-10-CM | POA: Insufficient documentation

## 2017-07-31 DIAGNOSIS — T7840XA Allergy, unspecified, initial encounter: Secondary | ICD-10-CM

## 2017-07-31 DIAGNOSIS — F17200 Nicotine dependence, unspecified, uncomplicated: Secondary | ICD-10-CM | POA: Insufficient documentation

## 2017-07-31 MED ORDER — PREDNISONE 20 MG PO TABS: 40.0000 mg | ORAL_TABLET | Freq: Every day | ORAL | 0 refills | Status: AC

## 2017-07-31 MED ORDER — EPINEPHRINE 0.3 MG/0.3ML IJ SOAJ: 0.3000 mg | Freq: Once | INTRAMUSCULAR | 0 refills | Status: AC

## 2017-07-31 MED ORDER — SODIUM CHLORIDE 0.9 % IV BOLUS
1000.0000 mL | Freq: Once | INTRAVENOUS | Status: AC
  Administered 2017-07-31: 1000 mL via INTRAVENOUS

## 2017-07-31 MED ORDER — METHYLPREDNISOLONE SODIUM SUCC 125 MG IJ SOLR
125.0000 mg | Freq: Once | INTRAMUSCULAR | Status: AC
  Administered 2017-07-31: 125 mg via INTRAVENOUS
  Filled 2017-07-31: qty 2

## 2017-07-31 MED ORDER — FAMOTIDINE IN NACL 20-0.9 MG/50ML-% IV SOLN
20.0000 mg | Freq: Once | INTRAVENOUS | Status: AC
  Administered 2017-07-31: 20 mg via INTRAVENOUS
  Filled 2017-07-31: qty 50

## 2017-07-31 NOTE — ED Triage Notes (Signed)
Pt states he was stung twice by bees and wife gave him 2 benadryl. Pt states his lips feel like they are swelling.

## 2017-07-31 NOTE — ED Provider Notes (Signed)
Emergency Department Provider Note   I have reviewed the triage vital signs and the nursing notes.   HISTORY  Chief Complaint Allergic Reaction   HPI Randy Schroeder is a 42 y.o. 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  The event happened immediately prior to ED presentation.  The patient states he began to feel red and flushed in his face, arms, legs.  His ears had a burning sensation.  He reported some tingling in the lips but denies any swelling.  He denies any sensation of throat tightness, difficult he breathing, tongue swelling, abdominal pain, nausea, vomiting, diarrhea.  He reports allergic reaction to bees in the distant past.  He was also evaluated earlier this summer with a bee sting to the arm which had persistent redness but did not result in the above-described reaction.  He has never required epinephrine, to his knowledge.  He took 50 mg of Benadryl prior to ED presentation.    Past Medical History:  Diagnosis Date  . GERD (gastroesophageal reflux disease)   . Hiatal hernia    Reported by patient 04/08/2012    Patient Active Problem List   Diagnosis Date Noted  . Perirectal abscess 10/10/2013  . RECTAL BLEEDING 01/23/2010  . ABDOMINAL PAIN-RUQ 01/23/2010    Past Surgical History:  Procedure Laterality Date  . INCISION AND DRAINAGE ABSCESS N/A 10/11/2013   Procedure: INCISION AND DRAINAGE PERI-RECTAL ABSCESS;  Surgeon: Dalia Heading, MD;  Location: [facility];  Service: General;  Laterality: N/A;    Allergies Bee venom  No family history on file.  Social History Social History   Tobacco Use  . Smoking status: Current Every Day Smoker  . Smokeless tobacco: Never Used  Substance Use Topics  . Alcohol use: No  . Drug use: No    Review of Systems  Constitutional: No fever/chills. Positive flushing and lip tingling.  Eyes: No visual changes. ENT: No sore  throat. Cardiovascular: Denies chest pain.  Respiratory: Denies shortness of breath. Gastrointestinal: No abdominal pain.  No nausea, no vomiting.  No diarrhea.  No constipation. Genitourinary: Negative for dysuria. Musculoskeletal: Negative for back pain. Skin: Negative for rash. Neurological: Negative for headaches, focal weakness or numbness. 10-point ROS otherwise negative.  ____________________________________________   PHYSICAL EXAM:  VITAL SIGNS: ED Triage Vitals  Enc Vitals Group     BP 07/31/17 2020 137/80     Pulse Rate 07/31/17 2020 92     Resp 07/31/17 2020 18     Temp 07/31/17 2020 98.2 F (36.8 C)     Temp Source 07/31/17 2020 Oral     SpO2 07/31/17 2020 98 %     Weight 07/31/17 2021 185 lb (83.9 kg)     Height 07/31/17 2021 5\' 11"  (1.803 m)     Pain Score 07/31/17 2020 5   Constitutional: Alert and oriented. Well appearing and in no acute distress. Patient is slightly flushed.  Eyes: Conjunctivae are normal. Head: Atraumatic. Nose: No congestion/rhinnorhea. Mouth/Throat: Mucous membranes are moist. Oropharynx is widely patent.  Neck: No stridor.  Cardiovascular: Normal rate, regular rhythm. Good peripheral circulation. Grossly normal heart sounds.   Respiratory: Normal respiratory effort.  No retractions. Lungs CTAB. Gastrointestinal: Soft and nontender. No distention.  Musculoskeletal: No lower extremity tenderness nor edema. No gross deformities of extremities. Neurologic:  Normal speech and language. No gross focal neurologic deficits are appreciated.  Skin:  Skin  is warm, dry and intact. No rash noted. ____________________________________________  RADIOLOGY  None ____________________________________________   PROCEDURES  Procedure(s) performed:   Procedures  None ____________________________________________   INITIAL IMPRESSION / ASSESSMENT AND PLAN / ED COURSE  Pertinent labs & imaging results that were available during my care of the  patient were reviewed by me and considered in my medical decision making (see chart for details).  Patient presents to the emergency department for evaluation of acute allergic reaction after bee sting.  He does not require epinephrine at this time.  His main symptoms are flushing and some tingling in the lips but I do not appreciate any swelling.  He is not complaining of any shortness of breath or throat tightness.  Plan to monitor the patient for 2 hours here in the emergency department.  Will administer IV fluids, Pepcid, steroid, in addition to Benadryl he received prior to ED presentation.  At discharge, patient will be provided EpiPen Rx.   Patient feeling much better on reassessment after 2 hours ED obs. Plan for EpiPen at home and steroid burst. Discussed the use of the EpiPen and need for continued PCP follow up.   At this time, I do not feel there is any life-threatening condition present. I have reviewed and discussed all results (EKG, imaging, lab, urine as appropriate), exam findings with patient. I have reviewed nursing notes and appropriate previous records.  I feel the patient is safe to be discharged home without further emergent workup. Discussed usual and customary return precautions. Patient and family (if present) verbalize understanding and are comfortable with this plan.  Patient will follow-up with their primary care provider. If they do not have a primary care provider, information for follow-up has been provided to them. All questions have been answered.  ____________________________________________  FINAL CLINICAL IMPRESSION(S) / ED DIAGNOSES  Final diagnoses:  Allergic reaction, initial encounter     MEDICATIONS GIVEN DURING THIS VISIT:  Medications  sodium chloride 0.9 % bolus 1,000 mL (0 mLs Intravenous Stopped 07/31/17 2154)  methylPREDNISolone sodium succinate (SOLU-MEDROL) 125 mg/2 mL injection 125 mg (125 mg Intravenous Given 07/31/17 2039)  famotidine (PEPCID)  IVPB 20 mg premix (0 mg Intravenous Stopped 07/31/17 2122)     NEW OUTPATIENT MEDICATIONS STARTED DURING THIS VISIT:  Discharge Medication List as of 07/31/2017 10:10 PM    START taking these medications   Details  EPINEPHrine (EPIPEN 2-PAK) 0.3 mg/0.3 mL IJ SOAJ injection Inject 0.3 mLs (0.3 mg total) into the muscle once for 1 dose., Starting Thu 07/31/2017, Print    predniSONE (DELTASONE) 20 MG tablet Take 2 tablets (40 mg total) by mouth daily for 4 days., Starting Fri 08/01/2017, Until Tue 08/05/2017, Print        Note:  This document was prepared using Dragon voice recognition software and may include unintentional dictation errors.  Alona BeneJoshua Long, MD Emergency Medicine    Long, Arlyss RepressJoshua G, MD 08/01/17 [phone]

## 2017-07-31 NOTE — Discharge Instructions (Signed)
